# Patient Record
Sex: Female | Born: 1950 | Race: Black or African American | Hispanic: No
Health system: Southern US, Community
[De-identification: ages and names within clinical notes are randomized; demographics above are authoritative.]

## PROBLEM LIST (undated history)

## (undated) DIAGNOSIS — A419 Sepsis, unspecified organism: Secondary | ICD-10-CM

## (undated) DIAGNOSIS — I482 Chronic atrial fibrillation, unspecified: Secondary | ICD-10-CM

## (undated) DIAGNOSIS — S27322A Contusion of lung, bilateral, initial encounter: Secondary | ICD-10-CM

## (undated) DIAGNOSIS — I270 Primary pulmonary hypertension: Secondary | ICD-10-CM

## (undated) DIAGNOSIS — R6520 Severe sepsis without septic shock: Secondary | ICD-10-CM

## (undated) DIAGNOSIS — S299XXA Unspecified injury of thorax, initial encounter: Secondary | ICD-10-CM

## (undated) DIAGNOSIS — J9621 Acute and chronic respiratory failure with hypoxia: Secondary | ICD-10-CM

---

## 2018-02-07 ENCOUNTER — Other Ambulatory Visit (HOSPITAL_COMMUNITY): Payer: Self-pay

## 2018-02-07 ENCOUNTER — Inpatient Hospital Stay
Admission: AD | Admit: 2018-02-07 | Discharge: 2018-03-08 | Disposition: A | Discharge status: Rehabilitation Facility | Payer: Medicare HMO | Source: Other Acute Inpatient Hospital | Attending: Internal Medicine | Admitting: Internal Medicine

## 2018-02-07 DIAGNOSIS — Z4659 Encounter for fitting and adjustment of other gastrointestinal appliance and device: Secondary | ICD-10-CM

## 2018-02-07 DIAGNOSIS — R6520 Severe sepsis without septic shock: Secondary | ICD-10-CM

## 2018-02-07 DIAGNOSIS — T85598A Other mechanical complication of other gastrointestinal prosthetic devices, implants and grafts, initial encounter: Secondary | ICD-10-CM

## 2018-02-07 DIAGNOSIS — I270 Primary pulmonary hypertension: Secondary | ICD-10-CM | POA: Diagnosis present

## 2018-02-07 DIAGNOSIS — A419 Sepsis, unspecified organism: Secondary | ICD-10-CM | POA: Diagnosis present

## 2018-02-07 DIAGNOSIS — J9621 Acute and chronic respiratory failure with hypoxia: Secondary | ICD-10-CM | POA: Diagnosis present

## 2018-02-07 DIAGNOSIS — J9690 Respiratory failure, unspecified, unspecified whether with hypoxia or hypercapnia: Secondary | ICD-10-CM

## 2018-02-07 DIAGNOSIS — I482 Chronic atrial fibrillation, unspecified: Secondary | ICD-10-CM | POA: Diagnosis present

## 2018-02-07 DIAGNOSIS — R52 Pain, unspecified: Secondary | ICD-10-CM

## 2018-02-07 DIAGNOSIS — S299XXA Unspecified injury of thorax, initial encounter: Secondary | ICD-10-CM | POA: Diagnosis present

## 2018-02-07 DIAGNOSIS — S27322A Contusion of lung, bilateral, initial encounter: Secondary | ICD-10-CM | POA: Diagnosis present

## 2018-02-07 HISTORY — DX: Sepsis, unspecified organism: A41.9

## 2018-02-07 HISTORY — DX: Sepsis, unspecified organism: R65.20

## 2018-02-07 HISTORY — DX: Unspecified injury of thorax, initial encounter: S29.9XXA

## 2018-02-07 HISTORY — DX: Primary pulmonary hypertension: I27.0

## 2018-02-07 HISTORY — DX: Chronic atrial fibrillation, unspecified: I48.20

## 2018-02-07 HISTORY — DX: Acute and chronic respiratory failure with hypoxia: J96.21

## 2018-02-07 HISTORY — DX: Contusion of lung, bilateral, initial encounter: S27.322A

## 2018-02-08 DIAGNOSIS — S27322S Contusion of lung, bilateral, sequela: Secondary | ICD-10-CM

## 2018-02-08 DIAGNOSIS — R6520 Severe sepsis without septic shock: Secondary | ICD-10-CM

## 2018-02-08 DIAGNOSIS — S299XXS Unspecified injury of thorax, sequela: Secondary | ICD-10-CM

## 2018-02-08 DIAGNOSIS — I482 Chronic atrial fibrillation, unspecified: Secondary | ICD-10-CM

## 2018-02-08 DIAGNOSIS — J9621 Acute and chronic respiratory failure with hypoxia: Secondary | ICD-10-CM

## 2018-02-08 DIAGNOSIS — I270 Primary pulmonary hypertension: Secondary | ICD-10-CM

## 2018-02-08 DIAGNOSIS — A419 Sepsis, unspecified organism: Secondary | ICD-10-CM

## 2018-02-08 LAB — CBC
HCT: 37.4 % (ref 36.0–46.0)
Hemoglobin: 11.1 g/dL — ABNORMAL LOW (ref 12.0–15.0)
MCH: 28.3 pg (ref 26.0–34.0)
MCHC: 29.7 g/dL — AB (ref 30.0–36.0)
MCV: 95.4 fL (ref 80.0–100.0)
Platelets: 277 10*3/uL (ref 150–400)
RBC: 3.92 MIL/uL (ref 3.87–5.11)
RDW: 22.3 % — AB (ref 11.5–15.5)
WBC: 4.6 10*3/uL (ref 4.0–10.5)
nRBC: 0 % (ref 0.0–0.2)

## 2018-02-08 LAB — BLOOD GAS, ARTERIAL
Acid-Base Excess: 2.9 mmol/L — ABNORMAL HIGH (ref 0.0–2.0)
Bicarbonate: 26.9 mmol/L (ref 20.0–28.0)
FIO2: 0.3
MECHVT: 450 mL
O2 Saturation: 93.2 %
PATIENT TEMPERATURE: 98.6
PEEP: 5 cmH2O
RATE: 12 resp/min
pCO2 arterial: 41.7 mmHg (ref 32.0–48.0)
pH, Arterial: 7.426 (ref 7.350–7.450)
pO2, Arterial: 67.9 mmHg — ABNORMAL LOW (ref 83.0–108.0)

## 2018-02-08 LAB — BASIC METABOLIC PANEL
Anion gap: 8 (ref 5–15)
BUN: 12 mg/dL (ref 8–23)
CO2: 25 mmol/L (ref 22–32)
Calcium: 9 mg/dL (ref 8.9–10.3)
Chloride: 100 mmol/L (ref 98–111)
Creatinine, Ser: 0.54 mg/dL (ref 0.44–1.00)
GFR calc Af Amer: >60 mL/min (ref >60)
GFR calc non Af Amer: >60 mL/min (ref >60)
Glucose, Bld: 87 mg/dL (ref 70–99)
Potassium: 3.7 mmol/L (ref 3.5–5.1)
SODIUM: 133 mmol/L — AB (ref 135–145)

## 2018-02-08 LAB — C DIFFICILE QUICK SCREEN W PCR REFLEX
C Diff antigen: POSITIVE — AB
C Diff interpretation: DETECTED
C Diff toxin: POSITIVE — AB

## 2018-02-08 MED ORDER — ONDANSETRON HCL 4 MG/2ML IJ SOLN
4.00 | INTRAMUSCULAR | Status: DC
Start: ? — End: 2018-02-08

## 2018-02-08 MED ORDER — MELATONIN 3 MG PO TABS
6.00 | ORAL_TABLET | ORAL | Status: DC
Start: ? — End: 2018-02-08

## 2018-02-08 MED ORDER — ACETAMINOPHEN 500 MG PO TABS
1000.00 | ORAL_TABLET | ORAL | Status: DC
Start: ? — End: 2018-02-08

## 2018-02-08 MED ORDER — PHENOL 1.4 % MT LIQD
1.00 | OROMUCOSAL | Status: DC
Start: ? — End: 2018-02-08

## 2018-02-08 MED ORDER — POLYETHYLENE GLYCOL 3350 17 G PO PACK
17.00 | PACK | ORAL | Status: DC
Start: 2018-02-07 — End: 2018-02-08

## 2018-02-08 MED ORDER — CYCLOBENZAPRINE HCL 5 MG PO TABS
5.00 | ORAL_TABLET | ORAL | Status: DC
Start: ? — End: 2018-02-08

## 2018-02-08 MED ORDER — IPRATROPIUM-ALBUTEROL 0.5-2.5 (3) MG/3ML IN SOLN
3.00 | RESPIRATORY_TRACT | Status: DC
Start: 2018-02-07 — End: 2018-02-08

## 2018-02-08 MED ORDER — GENERIC EXTERNAL MEDICATION
Status: DC
Start: ? — End: 2018-02-08

## 2018-02-08 MED ORDER — CHLORHEXIDINE GLUCONATE 0.12 % MT SOLN
15.00 | OROMUCOSAL | Status: DC
Start: ? — End: 2018-02-08

## 2018-02-08 MED ORDER — SENNOSIDES-DOCUSATE SODIUM 8.6-50 MG PO TABS
1.00 | ORAL_TABLET | ORAL | Status: DC
Start: 2018-02-07 — End: 2018-02-08

## 2018-02-08 MED ORDER — SERTRALINE HCL 25 MG PO TABS
25.00 | ORAL_TABLET | ORAL | Status: DC
Start: 2018-02-08 — End: 2018-02-08

## 2018-02-08 MED ORDER — LIDOCAINE HCL URETHRAL/MUCOSAL 2 % EX GEL
CUTANEOUS | Status: DC
Start: ? — End: 2018-02-08

## 2018-02-08 MED ORDER — WITCH HAZEL-GLYCERIN EX PADS
MEDICATED_PAD | CUTANEOUS | Status: DC
Start: ? — End: 2018-02-08

## 2018-02-08 MED ORDER — HYDROXYZINE HCL 25 MG PO TABS
25.00 | ORAL_TABLET | ORAL | Status: DC
Start: ? — End: 2018-02-08

## 2018-02-08 MED ORDER — GABAPENTIN 100 MG PO CAPS
100.00 | ORAL_CAPSULE | ORAL | Status: DC
Start: 2018-02-07 — End: 2018-02-08

## 2018-02-08 MED ORDER — CHLORHEXIDINE GLUCONATE 0.12 % MT SOLN
15.00 | OROMUCOSAL | Status: DC
Start: 2018-02-07 — End: 2018-02-08

## 2018-02-08 MED ORDER — GENERIC EXTERNAL MEDICATION
500.00 | Status: DC
Start: ? — End: 2018-02-08

## 2018-02-08 MED ORDER — SILDENAFIL CITRATE 20 MG PO TABS
20.00 | ORAL_TABLET | ORAL | Status: DC
Start: 2018-02-07 — End: 2018-02-08

## 2018-02-08 MED ORDER — ENOXAPARIN SODIUM 100 MG/ML ~~LOC~~ SOLN
1.00 | SUBCUTANEOUS | Status: DC
Start: 2018-02-07 — End: 2018-02-08

## 2018-02-08 MED ORDER — GENERIC EXTERNAL MEDICATION
100.00 | Status: DC
Start: ? — End: 2018-02-08

## 2018-02-08 MED ORDER — AMIODARONE HCL 200 MG PO TABS
400.00 | ORAL_TABLET | ORAL | Status: DC
Start: 2018-02-08 — End: 2018-02-08

## 2018-02-08 MED ORDER — FAMOTIDINE 20 MG PO TABS
20.00 | ORAL_TABLET | ORAL | Status: DC
Start: 2018-02-07 — End: 2018-02-08

## 2018-02-08 MED ORDER — PHENYLEPHRINE-MINERAL OIL-PET 0.25-14-74.9 % RE OINT
TOPICAL_OINTMENT | RECTAL | Status: DC
Start: ? — End: 2018-02-08

## 2018-02-08 MED ORDER — DIGOXIN 125 MCG PO TABS
0.13 | ORAL_TABLET | ORAL | Status: DC
Start: 2018-02-09 — End: 2018-02-08

## 2018-02-08 MED ORDER — GENERIC EXTERNAL MEDICATION
Status: DC
Start: 2018-02-08 — End: 2018-02-08

## 2018-02-08 MED ORDER — NYSTATIN 100000 UNIT/ML MT SUSP
500000.00 | OROMUCOSAL | Status: DC
Start: 2018-02-07 — End: 2018-02-08
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
## 2018-02-09 ENCOUNTER — Other Ambulatory Visit (HOSPITAL_COMMUNITY): Payer: Self-pay

## 2018-02-09 ENCOUNTER — Encounter: Payer: Self-pay | Admitting: Internal Medicine

## 2018-02-09 DIAGNOSIS — I482 Chronic atrial fibrillation, unspecified: Secondary | ICD-10-CM | POA: Diagnosis not present

## 2018-02-09 DIAGNOSIS — I270 Primary pulmonary hypertension: Secondary | ICD-10-CM | POA: Diagnosis present

## 2018-02-09 DIAGNOSIS — S27322S Contusion of lung, bilateral, sequela: Secondary | ICD-10-CM | POA: Diagnosis not present

## 2018-02-09 DIAGNOSIS — J9621 Acute and chronic respiratory failure with hypoxia: Secondary | ICD-10-CM | POA: Diagnosis present

## 2018-02-09 DIAGNOSIS — S27322A Contusion of lung, bilateral, initial encounter: Secondary | ICD-10-CM | POA: Diagnosis present

## 2018-02-09 DIAGNOSIS — R6520 Severe sepsis without septic shock: Secondary | ICD-10-CM

## 2018-02-09 DIAGNOSIS — S299XXS Unspecified injury of thorax, sequela: Secondary | ICD-10-CM | POA: Diagnosis not present

## 2018-02-09 DIAGNOSIS — A419 Sepsis, unspecified organism: Secondary | ICD-10-CM | POA: Diagnosis present

## 2018-02-09 DIAGNOSIS — S299XXA Unspecified injury of thorax, initial encounter: Secondary | ICD-10-CM | POA: Diagnosis present

## 2018-02-09 NOTE — Progress Notes (Addendum)
[facility] [place]   PULMONARY CRITICAL CARE SERVICE  PROGRESS NOTE  Date of Service: 02/09/2018  Barbara ShortLinda M Osborn  ZOX:[id number]  DOB: [birth year]   DOA: 02/07/2018  Referring Physician: Carron CurieAli Hijazi, MD  HPI: Barbara ShortLinda M Osborn is a 68 y.o. female seen for follow up of Acute on Chronic Respiratory Failure.  Patient remains on full support at this time.  She was able to do 8 hours on pressure support today 12/5 with an FiO2 28%.  She is now currently resting on the ventilator and weaning will continue tomorrow.  Medications: Reviewed on Rounds  Physical Exam:  Vitals: 73 respirations 24 blood pressure 124/72 O2 sat 99% temp 97.2  Ventilator Settings ventilator mode AC VC rate of 12 tidal volume 450 FiO2 28% PEEP of 5  . General: Comfortable at this time . Eyes: Grossly normal lids, irises & conjunctiva . ENT: grossly tongue is normal . Neck: no obvious mass . Cardiovascular: S1 S2 normal no gallop . Respiratory: Coarse breath sounds . Abdomen: soft . Skin: no rash seen on limited exam . Musculoskeletal: not rigid . Psychiatric:unable to assess . Neurologic: no seizure no involuntary movements         Lab Data:   Basic Metabolic Panel: Recent Labs  Lab 02/08/18 0450  NA 133*  K 3.7  CL 100  CO2 25  GLUCOSE 87  BUN 12  CREATININE 0.54  CALCIUM 9.0    ABG: Recent Labs  Lab 02/08/18 0515  PHART 7.426  PCO2ART 41.7  PO2ART 67.9*  HCO3 26.9  O2SAT 93.2    Liver Function Tests: No results for input(s): AST, ALT, ALKPHOS, BILITOT, PROT, ALBUMIN in the last 168 hours. No results for input(s): LIPASE, AMYLASE in the last 168 hours. No results for input(s): AMMONIA in the last 168 hours.  CBC: Recent Labs  Lab 02/08/18 0450  WBC 4.6  HGB 11.1*  HCT 37.4  MCV 95.4  PLT 277    Cardiac Enzymes: No results for input(s): CKTOTAL, CKMB, CKMBINDEX, TROPONINI in the last 168 hours.  BNP (last 3 results) No  results for input(s): BNP in the last 8760 hours.  ProBNP (last 3 results) No results for input(s): PROBNP in the last 8760 hours.  Radiological Exams: Dg Chest Port 1 View  Result Date: 02/09/2018 CLINICAL DATA:  Respiratory failure EXAM: PORTABLE CHEST 1 VIEW COMPARISON:  None. FINDINGS: Cardiac shadows within normal limits. Tracheostomy tube, feeding catheter and right chest wall port are noted in satisfactory position. Cardiac shadow is mildly enlarged but accentuated by the frontal technique. Left lung is well aerated with minimal basilar atelectasis. Volume loss on the right is noted consistent with prior surgical changes. Some basilar atelectatic changes are noted as well. No pneumothorax is seen. IMPRESSION: Bibasilar atelectasis. Postoperative changes on the right. Tubes and lines as described. Electronically Signed   By: Alcide CleverMark  Lukens M.D.   On: 02/09/2018 08:17   Dg Abd Portable 1v  Result Date: 02/07/2018 CLINICAL DATA:  NG tube placement EXAM: PORTABLE ABDOMEN - 1 VIEW COMPARISON:  None. FINDINGS: Feeding tube loops in the right lower abdomen which I suspect is looping in the distal stomach with the tip in the mid stomach. This could be within the duodenum, but I favor the tip being in the mid stomach. Nonobstructive bowel gas pattern. IMPRESSION: Feeding tube loops in the right lower abdomen, favor looping in the stomach with the tip in the mid stomach. This conceivably could be within  the duodenum. Exact location could be confirmed with contrast injection and repeat KUB. Electronically Signed   By: Charlett NoseKevin  Dover M.D.   On: 02/07/2018 21:04    Assessment/Plan Active Problems:   Acute on chronic respiratory failure with hypoxia (HCC)   Primary pulmonary hypertension (HCC)   Chronic atrial fibrillation   Bilateral pulmonary contusion   Severe sepsis (HCC)   Multiple trauma to chest   1. Acute on chronic respiratory failure with hypoxia patient remains on full support at this time.   She was able to do 8 hours on pressure support today and weaning will continue tomorrow. 2. Primary pulmonary hypertension patient is on sildenafil. 3. Chronic atrial fibrillation cardiology consult in place 4. Pulmonary contusion supportive care monitor for hemoptysis 5. Sepsis with shock hemodynamically stable at this time continue present therapy   I have personally seen and evaluated the patient, evaluated laboratory and imaging results, formulated the assessment and plan and placed orders. The Patient requires high complexity decision making for assessment and support.  Case was discussed on Rounds with the Respiratory Therapy Staff  Yevonne PaxSaadat A Khan, MD [facility] Pulmonary Critical Care Medicine Sleep Medicine

## 2018-02-10 DIAGNOSIS — S27322S Contusion of lung, bilateral, sequela: Secondary | ICD-10-CM | POA: Diagnosis not present

## 2018-02-10 DIAGNOSIS — J9621 Acute and chronic respiratory failure with hypoxia: Secondary | ICD-10-CM | POA: Diagnosis not present

## 2018-02-10 DIAGNOSIS — I482 Chronic atrial fibrillation, unspecified: Secondary | ICD-10-CM | POA: Diagnosis not present

## 2018-02-10 DIAGNOSIS — S299XXS Unspecified injury of thorax, sequela: Secondary | ICD-10-CM | POA: Diagnosis not present

## 2018-02-10 NOTE — Progress Notes (Signed)
[facility] [place]   PULMONARY CRITICAL CARE SERVICE  PROGRESS NOTE  Date of Service: 02/10/2018  Barbara ShortLinda M Osborn  ZOX:[id number]  DOB: 27-Feb-1950   DOA: 02/07/2018  Referring Physician: Carron CurieAli Hijazi, MD  HPI: Barbara ShortLinda M Osborn is a 68 y.o. female seen for follow up of Acute on Chronic Respiratory Failure.  Patient is doing well remains on pressure support weaning right now the goal is for 12 hours  Medications: Reviewed on Rounds  Physical Exam:  Vitals: Temperature 97.8 pulse 73 respiratory rate 17 blood pressure 117/72 saturations 98%  Ventilator Settings currently is on pressure support FiO2 28% tidal volume 450 PEEP 5 pressure support 12  . General: Comfortable at this time . Eyes: Grossly normal lids, irises & conjunctiva . ENT: grossly tongue is normal . Neck: no obvious mass . Cardiovascular: S1 S2 normal no gallop . Respiratory: Coarse breath sounds are noted bilaterally . Abdomen: soft . Skin: no rash seen on limited exam . Musculoskeletal: not rigid . Psychiatric:unable to assess . Neurologic: no seizure no involuntary movements         Lab Data:   Basic Metabolic Panel: Recent Labs  Lab 02/08/18 0450  NA 133*  K 3.7  CL 100  CO2 25  GLUCOSE 87  BUN 12  CREATININE 0.54  CALCIUM 9.0    ABG: Recent Labs  Lab 02/08/18 0515  PHART 7.426  PCO2ART 41.7  PO2ART 67.9*  HCO3 26.9  O2SAT 93.2    Liver Function Tests: No results for input(s): AST, ALT, ALKPHOS, BILITOT, PROT, ALBUMIN in the last 168 hours. No results for input(s): LIPASE, AMYLASE in the last 168 hours. No results for input(s): AMMONIA in the last 168 hours.  CBC: Recent Labs  Lab 02/08/18 0450  WBC 4.6  HGB 11.1*  HCT 37.4  MCV 95.4  PLT 277    Cardiac Enzymes: No results for input(s): CKTOTAL, CKMB, CKMBINDEX, TROPONINI in the last 168 hours.  BNP (last 3 results) No results for input(s): BNP in the last 8760  hours.  ProBNP (last 3 results) No results for input(s): PROBNP in the last 8760 hours.  Radiological Exams: Dg Chest Port 1 View  Result Date: 02/09/2018 CLINICAL DATA:  Respiratory failure EXAM: PORTABLE CHEST 1 VIEW COMPARISON:  None. FINDINGS: Cardiac shadows within normal limits. Tracheostomy tube, feeding catheter and right chest wall port are noted in satisfactory position. Cardiac shadow is mildly enlarged but accentuated by the frontal technique. Left lung is well aerated with minimal basilar atelectasis. Volume loss on the right is noted consistent with prior surgical changes. Some basilar atelectatic changes are noted as well. No pneumothorax is seen. IMPRESSION: Bibasilar atelectasis. Postoperative changes on the right. Tubes and lines as described. Electronically Signed   By: Alcide CleverMark  Lukens M.D.   On: 02/09/2018 08:17    Assessment/Plan Active Problems:   Acute on chronic respiratory failure with hypoxia (HCC)   Primary pulmonary hypertension (HCC)   Chronic atrial fibrillation   Bilateral pulmonary contusion   Severe sepsis (HCC)   Multiple trauma to chest   1. Acute on chronic respiratory failure with hypoxia we will continue the wean as tolerated continue secretion management pulmonary toilet. 2. Primary pulmonary hypertension continue with treatment as ordered 3. Chronic atrial fibrillation rate controlled 4. Pulmonary contusions follow-up chest x-ray still showing some postoperative changes and some atelectatic areas needs ongoing aggressive pulmonary toilet 5. Severe sepsis hemodynamically stable 6. Multiple chest trauma continue with supportive care therapy  as tolerated   I have personally seen and evaluated the patient, evaluated laboratory and imaging results, formulated the assessment and plan and placed orders. The Patient requires high complexity decision making for assessment and support.  Case was discussed on Rounds with the Respiratory Therapy Staff  Yevonne Pax, MD [facility] Pulmonary Critical Care Medicine Sleep Medicine

## 2018-02-11 DIAGNOSIS — S27322S Contusion of lung, bilateral, sequela: Secondary | ICD-10-CM | POA: Diagnosis not present

## 2018-02-11 DIAGNOSIS — I482 Chronic atrial fibrillation, unspecified: Secondary | ICD-10-CM | POA: Diagnosis not present

## 2018-02-11 DIAGNOSIS — S299XXS Unspecified injury of thorax, sequela: Secondary | ICD-10-CM | POA: Diagnosis not present

## 2018-02-11 DIAGNOSIS — J9621 Acute and chronic respiratory failure with hypoxia: Secondary | ICD-10-CM | POA: Diagnosis not present

## 2018-02-11 NOTE — Progress Notes (Signed)
Pulmonary Critical Care Medicine [facility] [place]   PULMONARY CRITICAL CARE SERVICE  PROGRESS NOTE  Date of Service: 02/11/2018  Barbara Osborn  MWU:[id number]  DOB: 06/21/1950   DOA: 02/07/2018  Referring Physician: Carron Curie, MD  HPI: Barbara Osborn is a 68 y.o. female seen for follow up of Acute on Chronic Respiratory Failure.  Patient is on pressure support weaning goal is for 16 hours  Medications: Reviewed on Rounds  Physical Exam:  Vitals: Temperature 97.6 pulse 77 respiratory 26 blood pressure 116/74 saturations 99%  Ventilator Settings currently on pressure support FiO2 28% tidal volume 672 pressure support 12 PEEP 5  . General: Comfortable at this time . Eyes: Grossly normal lids, irises & conjunctiva . ENT: grossly tongue is normal . Neck: no obvious mass . Cardiovascular: S1 S2 normal no gallop . Respiratory: Scattered rhonchi expansion is equal . Abdomen: soft . Skin: no rash seen on limited exam . Musculoskeletal: not rigid . Psychiatric:unable to assess . Neurologic: no seizure no involuntary movements         Lab Data:   Basic Metabolic Panel: Recent Labs  Lab 02/08/18 0450  NA 133*  K 3.7  CL 100  CO2 25  GLUCOSE 87  BUN 12  CREATININE 0.54  CALCIUM 9.0    ABG: Recent Labs  Lab 02/08/18 0515  PHART 7.426  PCO2ART 41.7  PO2ART 67.9*  HCO3 26.9  O2SAT 93.2    Liver Function Tests: No results for input(s): AST, ALT, ALKPHOS, BILITOT, PROT, ALBUMIN in the last 168 hours. No results for input(s): LIPASE, AMYLASE in the last 168 hours. No results for input(s): AMMONIA in the last 168 hours.  CBC: Recent Labs  Lab 02/08/18 0450  WBC 4.6  HGB 11.1*  HCT 37.4  MCV 95.4  PLT 277    Cardiac Enzymes: No results for input(s): CKTOTAL, CKMB, CKMBINDEX, TROPONINI in the last 168 hours.  BNP (last 3 results) No results for input(s): BNP in the last 8760 hours.  ProBNP (last 3 results) No results for input(s):  PROBNP in the last 8760 hours.  Radiological Exams: No results found.  Assessment/Plan Active Problems:   Acute on chronic respiratory failure with hypoxia (HCC)   Primary pulmonary hypertension (HCC)   Chronic atrial fibrillation   Bilateral pulmonary contusion   Severe sepsis (HCC)   Multiple trauma to chest   1. Acute on chronic respiratory failure with hypoxia we will continue with the pressure support mode as mentioned above the goal is 16 hours 2. Primary pulmonary hypertension continue with present management 3. Chronic atrial fibrillation rate controlled 4. Bilateral pulmonary contusions slow improvement 5. Severe sepsis hemodynamically stable 6. Multiple chest trauma continue with supportive therapy   I have personally seen and evaluated the patient, evaluated laboratory and imaging results, formulated the assessment and plan and placed orders. The Patient requires high complexity decision making for assessment and support.  Case was discussed on Rounds with the Respiratory Therapy Staff  Yevonne Pax, MD [facility] Pulmonary Critical Care Medicine Sleep Medicine

## 2018-02-12 DIAGNOSIS — S299XXS Unspecified injury of thorax, sequela: Secondary | ICD-10-CM | POA: Diagnosis not present

## 2018-02-12 DIAGNOSIS — I482 Chronic atrial fibrillation, unspecified: Secondary | ICD-10-CM | POA: Diagnosis not present

## 2018-02-12 DIAGNOSIS — S27322S Contusion of lung, bilateral, sequela: Secondary | ICD-10-CM | POA: Diagnosis not present

## 2018-02-12 DIAGNOSIS — J9621 Acute and chronic respiratory failure with hypoxia: Secondary | ICD-10-CM | POA: Diagnosis not present

## 2018-02-12 LAB — CBC
HCT: 39.3 % (ref 36.0–46.0)
HEMOGLOBIN: 11.8 g/dL — AB (ref 12.0–15.0)
MCH: 28.4 pg (ref 26.0–34.0)
MCHC: 30 g/dL (ref 30.0–36.0)
MCV: 94.7 fL (ref 80.0–100.0)
Platelets: 310 10*3/uL (ref 150–400)
RBC: 4.15 MIL/uL (ref 3.87–5.11)
RDW: 20.6 % — ABNORMAL HIGH (ref 11.5–15.5)
WBC: 5.7 10*3/uL (ref 4.0–10.5)
nRBC: 0 % (ref 0.0–0.2)

## 2018-02-12 LAB — BASIC METABOLIC PANEL
ANION GAP: 10 (ref 5–15)
BUN: 11 mg/dL (ref 8–23)
CO2: 31 mmol/L (ref 22–32)
Calcium: 9.1 mg/dL (ref 8.9–10.3)
Chloride: 92 mmol/L — ABNORMAL LOW (ref 98–111)
Creatinine, Ser: 0.54 mg/dL (ref 0.44–1.00)
GFR calc Af Amer: >60 mL/min (ref >60)
GFR calc non Af Amer: >60 mL/min (ref >60)
Glucose, Bld: 125 mg/dL — ABNORMAL HIGH (ref 70–99)
Potassium: 3.4 mmol/L — ABNORMAL LOW (ref 3.5–5.1)
Sodium: 133 mmol/L — ABNORMAL LOW (ref 135–145)

## 2018-02-12 LAB — PHOSPHORUS: Phosphorus: 3.2 mg/dL (ref 2.5–4.6)

## 2018-02-12 LAB — MAGNESIUM: Magnesium: 1.9 mg/dL (ref 1.7–2.4)

## 2018-02-12 NOTE — Progress Notes (Addendum)
Pulmonary Critical Care Medicine [facility] [place]   PULMONARY CRITICAL CARE SERVICE  PROGRESS NOTE  Date of Service: 02/12/2018  Barbara Osborn  MMC:[id number]  DOB: May 01, 1950   DOA: 02/07/2018  Referring Physician: Carron Curie, MD  HPI: Barbara Osborn is a 68 y.o. female seen for follow up of Acute on Chronic Respiratory Failure.  Patient is doing well and was able to go on trach collar with 40% FiO2 for 2 hours today.  She is currently back on pressure support 12/5 to rest.  Medications: Reviewed on Rounds  Physical Exam:  Vitals: Pulse 76 respirations 20 BP 127/75 O2 sat 93% temp 98.2  Ventilator Settings ventilator mode pressure support 12/5  . General: Comfortable at this time . Eyes: Grossly normal lids, irises & conjunctiva . ENT: grossly tongue is normal . Neck: no obvious mass . Cardiovascular: S1 S2 normal no gallop . Respiratory: Coarse breath sounds . Abdomen: soft . Skin: no rash seen on limited exam . Musculoskeletal: not rigid . Psychiatric:unable to assess . Neurologic: no seizure no involuntary movements         Lab Data:   Basic Metabolic Panel: Recent Labs  Lab 02/08/18 0450 02/12/18 0651  NA 133* 133*  K 3.7 3.4*  CL 100 92*  CO2 25 31  GLUCOSE 87 125*  BUN 12 11  CREATININE 0.54 0.54  CALCIUM 9.0 9.1  MG  --  1.9  PHOS  --  3.2    ABG: Recent Labs  Lab 02/08/18 0515  PHART 7.426  PCO2ART 41.7  PO2ART 67.9*  HCO3 26.9  O2SAT 93.2    Liver Function Tests: No results for input(s): AST, ALT, ALKPHOS, BILITOT, PROT, ALBUMIN in the last 168 hours. No results for input(s): LIPASE, AMYLASE in the last 168 hours. No results for input(s): AMMONIA in the last 168 hours.  CBC: Recent Labs  Lab 02/08/18 0450 02/12/18 0651  WBC 4.6 5.7  HGB 11.1* 11.8*  HCT 37.4 39.3  MCV 95.4 94.7  PLT 277 310    Cardiac Enzymes: No results for input(s): CKTOTAL, CKMB, CKMBINDEX, TROPONINI in the last 168 hours.  BNP (last  3 results) No results for input(s): BNP in the last 8760 hours.  ProBNP (last 3 results) No results for input(s): PROBNP in the last 8760 hours.  Radiological Exams: No results found.  Assessment/Plan Active Problems:   Acute on chronic respiratory failure with hypoxia (HCC)   Primary pulmonary hypertension (HCC)   Chronic atrial fibrillation   Bilateral pulmonary contusion   Severe sepsis (HCC)   Multiple trauma to chest   1. Acute on chronic respiratory failure with hypoxia continue trach collar trials as per protocol.  Continue aggressive pulmonary toilet. 2. Primary pulmonary hypertension continue present management 3. Chronic atrial fibrillation rate controlled 4. Bilateral pulmonary contusion slow improvement 5. Severe sepsis hemodynamically stable 6. Multiple chest trauma continue supportive therapy   I have personally seen and evaluated the patient, evaluated laboratory and imaging results, formulated the assessment and plan and placed orders. The Patient requires high complexity decision making for assessment and support.  Case was discussed on Rounds with the Respiratory Therapy Staff  Yevonne Pax, MD [facility] Pulmonary Critical Care Medicine Sleep Medicine

## 2018-02-13 DIAGNOSIS — S299XXS Unspecified injury of thorax, sequela: Secondary | ICD-10-CM | POA: Diagnosis not present

## 2018-02-13 DIAGNOSIS — S27322S Contusion of lung, bilateral, sequela: Secondary | ICD-10-CM | POA: Diagnosis not present

## 2018-02-13 DIAGNOSIS — J9621 Acute and chronic respiratory failure with hypoxia: Secondary | ICD-10-CM | POA: Diagnosis not present

## 2018-02-13 DIAGNOSIS — I482 Chronic atrial fibrillation, unspecified: Secondary | ICD-10-CM | POA: Diagnosis not present

## 2018-02-13 LAB — BASIC METABOLIC PANEL
Anion gap: 13 (ref 5–15)
BUN: 13 mg/dL (ref 8–23)
CO2: 32 mmol/L (ref 22–32)
Calcium: 9.3 mg/dL (ref 8.9–10.3)
Chloride: 88 mmol/L — ABNORMAL LOW (ref 98–111)
Creatinine, Ser: 0.7 mg/dL (ref 0.44–1.00)
GFR calc Af Amer: >60 mL/min (ref >60)
GFR calc non Af Amer: >60 mL/min (ref >60)
Glucose, Bld: 125 mg/dL — ABNORMAL HIGH (ref 70–99)
Potassium: 3.8 mmol/L (ref 3.5–5.1)
Sodium: 133 mmol/L — ABNORMAL LOW (ref 135–145)

## 2018-02-13 LAB — PROTIME-INR
INR: 1.09
PROTHROMBIN TIME: 14 s (ref 11.4–15.2)

## 2018-02-13 NOTE — Progress Notes (Addendum)
[facility] [place]   PULMONARY CRITICAL CARE SERVICE  PROGRESS NOTE  Date of Service: 02/13/2018  Barbara ShortLinda M Osborn  [id number]:[id number]  DOB: 1950/06/07   DOA: 02/07/2018  Referring Physician: Carron CurieAli Hijazi, MD  HPI: Barbara ShortLinda M Osborn is a 68 y.o. female seen for follow up of Acute on Chronic Respiratory Failure.  Patient is currently doing well on trach collar FiO2 40%.  The goal today is 4 hours.  And then the patient will rest on pressure support 12/5 with an FiO2 of 40%.  Minimal secretions noted at this time  Medications: Reviewed on Rounds  Physical Exam:  Vitals: Pulse 73 respirations 21 BP 135/80 O2 sat 99% temp 97.2  Ventilator Settings patient's not currently on ventilator  . General: Comfortable at this time . Eyes: Grossly normal lids, irises & conjunctiva . ENT: grossly tongue is normal . Neck: no obvious mass . Cardiovascular: S1 S2 normal no gallop . Respiratory: Coarse breath sounds . Abdomen: soft . Skin: no rash seen on limited exam . Musculoskeletal: not rigid . Psychiatric:unable to assess . Neurologic: no seizure no involuntary movements         Lab Data:   Basic Metabolic Panel: Recent Labs  Lab 02/08/18 0450 02/12/18 0651 02/13/18 0903  NA 133* 133* 133*  K 3.7 3.4* 3.8  CL 100 92* 88*  CO2 25 31 32  GLUCOSE 87 125* 125*  BUN 12 11 13   CREATININE 0.54 0.54 0.70  CALCIUM 9.0 9.1 9.3  MG  --  1.9  --   PHOS  --  3.2  --     ABG: Recent Labs  Lab 02/08/18 0515  PHART 7.426  PCO2ART 41.7  PO2ART 67.9*  HCO3 26.9  O2SAT 93.2    Liver Function Tests: No results for input(s): AST, ALT, ALKPHOS, BILITOT, PROT, ALBUMIN in the last 168 hours. No results for input(s): LIPASE, AMYLASE in the last 168 hours. No results for input(s): AMMONIA in the last 168 hours.  CBC: Recent Labs  Lab 02/08/18 0450 02/12/18 0651  WBC 4.6 5.7  HGB 11.1* 11.8*  HCT 37.4 39.3  MCV 95.4 94.7  PLT 277 310     Cardiac Enzymes: No results for input(s): CKTOTAL, CKMB, CKMBINDEX, TROPONINI in the last 168 hours.  BNP (last 3 results) No results for input(s): BNP in the last 8760 hours.  ProBNP (last 3 results) No results for input(s): PROBNP in the last 8760 hours.  Radiological Exams: No results found.  Assessment/Plan Active Problems:   Acute on chronic respiratory failure with hypoxia (HCC)   Primary pulmonary hypertension (HCC)   Chronic atrial fibrillation   Bilateral pulmonary contusion   Severe sepsis (HCC)   Multiple trauma to chest   1. Acute on chronic respiratory failure with hypoxia continue trach collar trials per protocol.  Continue aggressive pulmonary toilet and secretion management. 2. Primary pulmonary hypertension continue present management 3. Chronic atrial fibrillation rate controlled 4. Bilateral pulmonary contusions no improvement 5. Severe sepsis hemodynamically stable 6. Multiple chest trauma continue supportive therapy   I have personally seen and evaluated the patient, evaluated laboratory and imaging results, formulated the assessment and plan and placed orders. The Patient requires high complexity decision making for assessment and support.  Case was discussed on Rounds with the Respiratory Therapy Staff  Yevonne PaxSaadat A Khan, MD [facility] Pulmonary Critical Care Medicine Sleep Medicine

## 2018-02-14 ENCOUNTER — Other Ambulatory Visit (HOSPITAL_COMMUNITY): Payer: Self-pay

## 2018-02-14 DIAGNOSIS — I482 Chronic atrial fibrillation, unspecified: Secondary | ICD-10-CM | POA: Diagnosis not present

## 2018-02-14 DIAGNOSIS — S27322S Contusion of lung, bilateral, sequela: Secondary | ICD-10-CM | POA: Diagnosis not present

## 2018-02-14 DIAGNOSIS — J9621 Acute and chronic respiratory failure with hypoxia: Secondary | ICD-10-CM | POA: Diagnosis not present

## 2018-02-14 DIAGNOSIS — S299XXS Unspecified injury of thorax, sequela: Secondary | ICD-10-CM | POA: Diagnosis not present

## 2018-02-14 LAB — URIC ACID: Uric Acid, Serum: 6.3 mg/dL (ref 2.5–7.1)

## 2018-02-14 LAB — PROTIME-INR
INR: 1.13
Prothrombin Time: 14.4 seconds (ref 11.4–15.2)

## 2018-02-14 NOTE — Progress Notes (Signed)
[facility] [place]   PULMONARY CRITICAL CARE SERVICE  PROGRESS NOTE  Date of Service: 02/14/2018  Barbara ShortLinda M Mirabal  [id number]  DOB: December 18, 1950   DOA: 02/07/2018  Referring Physician: Carron CurieAli Hijazi, MD  HPI: Barbara Osborn is a 68 y.o. female seen for follow up of Acute on Chronic Respiratory Failure.  Patient right now is on T collar the goal is for 8 hours doing fairly well at this time  Medications: Reviewed on Rounds  Physical Exam:  Vitals: Temperature 97.7 pulse 73 respiratory 24 blood pressure 110/65 saturations 99%  Ventilator Settings off the ventilator on T collar right now  . General: Comfortable at this time . Eyes: Grossly normal lids, irises & conjunctiva . ENT: grossly tongue is normal . Neck: no obvious mass . Cardiovascular: S1 S2 normal no gallop . Respiratory: Scattered rhonchi expansion is equal . Abdomen: soft . Skin: no rash seen on limited exam . Musculoskeletal: not rigid . Psychiatric:unable to assess . Neurologic: no seizure no involuntary movements         Lab Data:   Basic Metabolic Panel: Recent Labs  Lab 02/08/18 0450 02/12/18 0651 02/13/18 0903  NA 133* 133* 133*  K 3.7 3.4* 3.8  CL 100 92* 88*  CO2 25 31 32  GLUCOSE 87 125* 125*  BUN 12 11 13   CREATININE 0.54 0.54 0.70  CALCIUM 9.0 9.1 9.3  MG  --  1.9  --   PHOS  --  3.2  --     ABG: Recent Labs  Lab 02/08/18 0515  PHART 7.426  PCO2ART 41.7  PO2ART 67.9*  HCO3 26.9  O2SAT 93.2    Liver Function Tests: No results for input(s): AST, ALT, ALKPHOS, BILITOT, PROT, ALBUMIN in the last 168 hours. No results for input(s): LIPASE, AMYLASE in the last 168 hours. No results for input(s): AMMONIA in the last 168 hours.  CBC: Recent Labs  Lab 02/08/18 0450 02/12/18 0651  WBC 4.6 5.7  HGB 11.1* 11.8*  HCT 37.4 39.3  MCV 95.4 94.7  PLT 277 310    Cardiac Enzymes: No results for input(s): CKTOTAL, CKMB, CKMBINDEX,  TROPONINI in the last 168 hours.  BNP (last 3 results) No results for input(s): BNP in the last 8760 hours.  ProBNP (last 3 results) No results for input(s): PROBNP in the last 8760 hours.  Radiological Exams: No results found.  Assessment/Plan Active Problems:   Acute on chronic respiratory failure with hypoxia (HCC)   Primary pulmonary hypertension (HCC)   Chronic atrial fibrillation   Bilateral pulmonary contusion   Severe sepsis (HCC)   Multiple trauma to chest   1. Acute on chronic respiratory failure with hypoxia we will continue with T collar titrate oxygen continue pulmonary toilet.  The goal is for 8 hours 2. Primary pulmonary hypertension on oxygen therapy continue present management 3. Chronic atrial fibrillation rate is controlled 4. Pulmonary contusion at baseline continue to follow 5. Severe sepsis resolved 6. Multiple trauma to the chest she is doing better with the wean we will continue to advance   I have personally seen and evaluated the patient, evaluated laboratory and imaging results, formulated the assessment and plan and placed orders. The Patient requires high complexity decision making for assessment and support.  Case was discussed on Rounds with the Respiratory Therapy Staff  Yevonne PaxSaadat A Dejanee Thibeaux, MD [facility] Pulmonary Critical Care Medicine Sleep Medicine

## 2018-02-15 DIAGNOSIS — J9621 Acute and chronic respiratory failure with hypoxia: Secondary | ICD-10-CM | POA: Diagnosis not present

## 2018-02-15 DIAGNOSIS — I482 Chronic atrial fibrillation, unspecified: Secondary | ICD-10-CM | POA: Diagnosis not present

## 2018-02-15 DIAGNOSIS — S299XXS Unspecified injury of thorax, sequela: Secondary | ICD-10-CM | POA: Diagnosis not present

## 2018-02-15 DIAGNOSIS — S27322S Contusion of lung, bilateral, sequela: Secondary | ICD-10-CM | POA: Diagnosis not present

## 2018-02-15 LAB — PROTIME-INR
INR: 1.12
Prothrombin Time: 14.3 seconds (ref 11.4–15.2)

## 2018-02-15 LAB — C-REACTIVE PROTEIN: CRP: 2.7 mg/dL — ABNORMAL HIGH (ref <1.0)

## 2018-02-15 LAB — SEDIMENTATION RATE: Sed Rate: 98 mm/hr — ABNORMAL HIGH (ref 0–22)

## 2018-02-15 NOTE — Progress Notes (Signed)
Pulmonary Critical Care Medicine [facility] [place]   PULMONARY CRITICAL CARE SERVICE  PROGRESS NOTE  Date of Service: 02/15/2018  Barbara Osborn  [id number]  DOB: 12/21/1950   DOA: 02/07/2018  Referring Physician: Carron Curie, MD  HPI: Barbara Osborn is a 68 y.o. female seen for follow up of Acute on Chronic Respiratory Failure.  Currently patient is on a wean as her goal is 12 hours on T collar  Medications: Reviewed on Rounds  Physical Exam:  Vitals: Pitcher 98.6 pulse 80 respiratory 19 blood pressure 134/67 saturations 96%  Ventilator Settings off the ventilator on T collar  . General: Comfortable at this time . Eyes: Grossly normal lids, irises & conjunctiva . ENT: grossly tongue is normal . Neck: no obvious mass . Cardiovascular: S1 S2 normal no gallop . Respiratory: No rhonchi or rales are noted at this time . Abdomen: soft . Skin: no rash seen on limited exam . Musculoskeletal: not rigid . Psychiatric:unable to assess . Neurologic: no seizure no involuntary movements         Lab Data:   Basic Metabolic Panel: Recent Labs  Lab 02/12/18 0651 02/13/18 0903  NA 133* 133*  K 3.4* 3.8  CL 92* 88*  CO2 31 32  GLUCOSE 125* 125*  BUN 11 13  CREATININE 0.54 0.70  CALCIUM 9.1 9.3  MG 1.9  --   PHOS 3.2  --     ABG: No results for input(s): PHART, PCO2ART, PO2ART, HCO3, O2SAT in the last 168 hours.  Liver Function Tests: No results for input(s): AST, ALT, ALKPHOS, BILITOT, PROT, ALBUMIN in the last 168 hours. No results for input(s): LIPASE, AMYLASE in the last 168 hours. No results for input(s): AMMONIA in the last 168 hours.  CBC: Recent Labs  Lab 02/12/18 0651  WBC 5.7  HGB 11.8*  HCT 39.3  MCV 94.7  PLT 310    Cardiac Enzymes: No results for input(s): CKTOTAL, CKMB, CKMBINDEX, TROPONINI in the last 168 hours.  BNP (last 3 results) No results for input(s): BNP in the last 8760 hours.  ProBNP (last 3 results) No results  for input(s): PROBNP in the last 8760 hours.  Radiological Exams: Dg Knee Left Port  Result Date: 02/14/2018 CLINICAL DATA:  Acute left knee pain for the past day.  No injury. EXAM: PORTABLE LEFT KNEE - 1-2 VIEW COMPARISON:  None. FINDINGS: No evidence of fracture, dislocation, or joint effusion. No evidence of arthropathy or other focal bone abnormality. Soft tissues are unremarkable. IMPRESSION: Negative. Electronically Signed   By: Obie Dredge M.D.   On: 02/14/2018 15:33    Assessment/Plan Active Problems:   Acute on chronic respiratory failure with hypoxia (HCC)   Primary pulmonary hypertension (HCC)   Chronic atrial fibrillation   Bilateral pulmonary contusion   Severe sepsis (HCC)   Multiple trauma to chest   1. Acute on chronic respiratory failure with hypoxia continue to wean on T collar goal 12 hours continue with aggressive pulmonary toilet 2. Primary pulmonary hypertension on oxygen therapy supportive care 3. Chronic atrial fibrillation rate controlled 4. Bilateral pulmonary contusions slowly improving 5. Severe sepsis hemodynamically stable 6. Multiple chest traumas continue with present management supportive care   I have personally seen and evaluated the patient, evaluated laboratory and imaging results, formulated the assessment and plan and placed orders. The Patient requires high complexity decision making for assessment and support.  Case was discussed on Rounds with the Respiratory Therapy Staff  Yevonne Pax, MD  [facility] Pulmonary Critical Care Medicine Sleep Medicine

## 2018-02-16 DIAGNOSIS — I482 Chronic atrial fibrillation, unspecified: Secondary | ICD-10-CM | POA: Diagnosis not present

## 2018-02-16 DIAGNOSIS — S27322S Contusion of lung, bilateral, sequela: Secondary | ICD-10-CM | POA: Diagnosis not present

## 2018-02-16 DIAGNOSIS — S299XXS Unspecified injury of thorax, sequela: Secondary | ICD-10-CM | POA: Diagnosis not present

## 2018-02-16 DIAGNOSIS — J9621 Acute and chronic respiratory failure with hypoxia: Secondary | ICD-10-CM | POA: Diagnosis not present

## 2018-02-16 LAB — PROTIME-INR
INR: 1.1
Prothrombin Time: 14.1 seconds (ref 11.4–15.2)

## 2018-02-16 NOTE — Progress Notes (Signed)
Pulmonary Critical Care Medicine [facility] [place]   PULMONARY CRITICAL CARE SERVICE  PROGRESS NOTE  Date of Service: 02/16/2018  SADIYAH THEILEN  [id number]  DOB: 05/01/1950   DOA: 02/07/2018  Referring Physician: Carron Curie, MD  HPI: Barbara Osborn is a 68 y.o. female seen for follow up of Acute on Chronic Respiratory Failure.  Currently is on T collar with a goal of 16 hours looks good so far  Medications: Reviewed on Rounds  Physical Exam:  Vitals: Temperature 97.9 pulse 85 respiratory 22 blood pressure 106/71 saturations 97%  Ventilator Settings off the ventilator on T collar at this time currently on 35% FiO2  . General: Comfortable at this time . Eyes: Grossly normal lids, irises & conjunctiva . ENT: grossly tongue is normal . Neck: no obvious mass . Cardiovascular: S1 S2 normal no gallop . Respiratory: No rhonchi or rales are noted at this time . Abdomen: soft . Skin: no rash seen on limited exam . Musculoskeletal: not rigid . Psychiatric:unable to assess . Neurologic: no seizure no involuntary movements         Lab Data:   Basic Metabolic Panel: Recent Labs  Lab 02/12/18 0651 02/13/18 0903  NA 133* 133*  K 3.4* 3.8  CL 92* 88*  CO2 31 32  GLUCOSE 125* 125*  BUN 11 13  CREATININE 0.54 0.70  CALCIUM 9.1 9.3  MG 1.9  --   PHOS 3.2  --     ABG: No results for input(s): PHART, PCO2ART, PO2ART, HCO3, O2SAT in the last 168 hours.  Liver Function Tests: No results for input(s): AST, ALT, ALKPHOS, BILITOT, PROT, ALBUMIN in the last 168 hours. No results for input(s): LIPASE, AMYLASE in the last 168 hours. No results for input(s): AMMONIA in the last 168 hours.  CBC: Recent Labs  Lab 02/12/18 0651  WBC 5.7  HGB 11.8*  HCT 39.3  MCV 94.7  PLT 310    Cardiac Enzymes: No results for input(s): CKTOTAL, CKMB, CKMBINDEX, TROPONINI in the last 168 hours.  BNP (last 3 results) No results for input(s): BNP in the last 8760  hours.  ProBNP (last 3 results) No results for input(s): PROBNP in the last 8760 hours.  Radiological Exams: Dg Knee Left Port  Result Date: 02/14/2018 CLINICAL DATA:  Acute left knee pain for the past day.  No injury. EXAM: PORTABLE LEFT KNEE - 1-2 VIEW COMPARISON:  None. FINDINGS: No evidence of fracture, dislocation, or joint effusion. No evidence of arthropathy or other focal bone abnormality. Soft tissues are unremarkable. IMPRESSION: Negative. Electronically Signed   By: Obie Dredge M.D.   On: 02/14/2018 15:33    Assessment/Plan Active Problems:   Acute on chronic respiratory failure with hypoxia (HCC)   Primary pulmonary hypertension (HCC)   Chronic atrial fibrillation   Bilateral pulmonary contusion   Severe sepsis (HCC)   Multiple trauma to chest   1. Acute on chronic respiratory failure with hypoxia we will continue with T collar with a goal of 16 hours 2. Pulmonary hypertension at baseline continue with supportive care 3. Chronic atrial fibrillation rate is controlled at this time 4. Pulmonary contusion improving 5. Severe sepsis resolved 6. Multiple chest trauma continue present management   I have personally seen and evaluated the patient, evaluated laboratory and imaging results, formulated the assessment and plan and placed orders. The Patient requires high complexity decision making for assessment and support.  Case was discussed on Rounds with the Respiratory Therapy Staff  [facility]  Richardson Dopp, MD [facility] Pulmonary Critical Care Medicine Sleep Medicine

## 2018-02-17 DIAGNOSIS — I482 Chronic atrial fibrillation, unspecified: Secondary | ICD-10-CM | POA: Diagnosis not present

## 2018-02-17 DIAGNOSIS — S299XXS Unspecified injury of thorax, sequela: Secondary | ICD-10-CM | POA: Diagnosis not present

## 2018-02-17 DIAGNOSIS — S27322S Contusion of lung, bilateral, sequela: Secondary | ICD-10-CM | POA: Diagnosis not present

## 2018-02-17 DIAGNOSIS — J9621 Acute and chronic respiratory failure with hypoxia: Secondary | ICD-10-CM | POA: Diagnosis not present

## 2018-02-17 LAB — PROTIME-INR
INR: 1.26
Prothrombin Time: 15.6 seconds — ABNORMAL HIGH (ref 11.4–15.2)

## 2018-02-17 NOTE — Progress Notes (Signed)
Pulmonary Critical Care Medicine [facility] [place]   PULMONARY CRITICAL CARE SERVICE  PROGRESS NOTE  Date of Service: 02/17/2018  Barbara Osborn  [id number]  DOB: [birth year]   DOA: 02/07/2018  Referring Physician: Carron Curie, MD  HPI: Barbara Osborn is a 68 y.o. female seen for follow up of Acute on Chronic Respiratory Failure.  Currently on 35% FiO2 on T collar patient has been tolerating the wean very well with goal today is for 20 hours  Medications: Reviewed on Rounds  Physical Exam:  Vitals: Temperature 97.1 pulse 74 respiratory 25 blood pressure 115/75 saturations 98%  Ventilator Settings on T collar FiO2 35%  . General: Comfortable at this time . Eyes: Grossly normal lids, irises & conjunctiva . ENT: grossly tongue is normal . Neck: no obvious mass . Cardiovascular: S1 S2 normal no gallop . Respiratory: Scattered rhonchi expansion is equal at this time . Abdomen: soft . Skin: no rash seen on limited exam . Musculoskeletal: not rigid . Psychiatric:unable to assess . Neurologic: no seizure no involuntary movements         Lab Data:   Basic Metabolic Panel: Recent Labs  Lab 02/12/18 0651 02/13/18 0903  NA 133* 133*  K 3.4* 3.8  CL 92* 88*  CO2 31 32  GLUCOSE 125* 125*  BUN 11 13  CREATININE 0.54 0.70  CALCIUM 9.1 9.3  MG 1.9  --   PHOS 3.2  --     ABG: No results for input(s): PHART, PCO2ART, PO2ART, HCO3, O2SAT in the last 168 hours.  Liver Function Tests: No results for input(s): AST, ALT, ALKPHOS, BILITOT, PROT, ALBUMIN in the last 168 hours. No results for input(s): LIPASE, AMYLASE in the last 168 hours. No results for input(s): AMMONIA in the last 168 hours.  CBC: Recent Labs  Lab 02/12/18 0651  WBC 5.7  HGB 11.8*  HCT 39.3  MCV 94.7  PLT 310    Cardiac Enzymes: No results for input(s): CKTOTAL, CKMB, CKMBINDEX, TROPONINI in the last 168 hours.  BNP (last 3 results) No results for input(s): BNP in the last 8760  hours.  ProBNP (last 3 results) No results for input(s): PROBNP in the last 8760 hours.  Radiological Exams: No results found.  Assessment/Plan Active Problems:   Acute on chronic respiratory failure with hypoxia (HCC)   Primary pulmonary hypertension (HCC)   Chronic atrial fibrillation   Bilateral pulmonary contusion   Severe sepsis (HCC)   Multiple trauma to chest   1. Acute on chronic respiratory failure with hypoxia we will continue with T collar trials on 35% FiO2 continue pulmonary toilet secretion management.  Goal as mentioned is 20 hours 2. Pulmonary hypertension at baseline we will continue with supportive care 3. Chronic atrial fibrillation rate controlled 4. Severe sepsis hemodynamically stable resolved 5. Chest trauma continue with pain management supportive care clinically stable improving 6. Pulmonary contusion shows improvement clinically tolerating weaning   I have personally seen and evaluated the patient, evaluated laboratory and imaging results, formulated the assessment and plan and placed orders. The Patient requires high complexity decision making for assessment and support.  Case was discussed on Rounds with the Respiratory Therapy Staff  Yevonne Pax, MD [facility] Pulmonary Critical Care Medicine Sleep Medicine

## 2018-02-18 DIAGNOSIS — S27322S Contusion of lung, bilateral, sequela: Secondary | ICD-10-CM | POA: Diagnosis not present

## 2018-02-18 DIAGNOSIS — J9621 Acute and chronic respiratory failure with hypoxia: Secondary | ICD-10-CM | POA: Diagnosis not present

## 2018-02-18 DIAGNOSIS — S299XXS Unspecified injury of thorax, sequela: Secondary | ICD-10-CM | POA: Diagnosis not present

## 2018-02-18 DIAGNOSIS — I482 Chronic atrial fibrillation, unspecified: Secondary | ICD-10-CM | POA: Diagnosis not present

## 2018-02-18 LAB — PROTIME-INR
INR: 1.45
Prothrombin Time: 17.5 seconds — ABNORMAL HIGH (ref 11.4–15.2)

## 2018-02-18 NOTE — Progress Notes (Signed)
Pulmonary Critical Care Medicine [facility] [place]   PULMONARY CRITICAL CARE SERVICE  PROGRESS NOTE  Date of Service: 02/18/2018  Barbara Osborn  [id number]  DOB: [birth year]   DOA: 02/07/2018  Referring Physician: Carron Curie, MD  HPI: Barbara Osborn is a 68 y.o. female seen for follow up of Acute on Chronic Respiratory Failure.  Patient is on 35% FiO2 on T collar goal is 24 hours today  Medications: Reviewed on Rounds  Physical Exam:  Vitals: Temperature 97.1 pulse 74 respiratory 24 blood pressure 120/62 saturation 98%  Ventilator Settings off ventilator on T collar  . General: Comfortable at this time . Eyes: Grossly normal lids, irises & conjunctiva . ENT: grossly tongue is normal . Neck: no obvious mass . Cardiovascular: S1 S2 normal no gallop . Respiratory: Scattered rhonchi expansion is equal . Abdomen: soft . Skin: no rash seen on limited exam . Musculoskeletal: not rigid . Psychiatric:unable to assess . Neurologic: no seizure no involuntary movements         Lab Data:   Basic Metabolic Panel: Recent Labs  Lab 02/12/18 0651 02/13/18 0903  NA 133* 133*  K 3.4* 3.8  CL 92* 88*  CO2 31 32  GLUCOSE 125* 125*  BUN 11 13  CREATININE 0.54 0.70  CALCIUM 9.1 9.3  MG 1.9  --   PHOS 3.2  --     ABG: No results for input(s): PHART, PCO2ART, PO2ART, HCO3, O2SAT in the last 168 hours.  Liver Function Tests: No results for input(s): AST, ALT, ALKPHOS, BILITOT, PROT, ALBUMIN in the last 168 hours. No results for input(s): LIPASE, AMYLASE in the last 168 hours. No results for input(s): AMMONIA in the last 168 hours.  CBC: Recent Labs  Lab 02/12/18 0651  WBC 5.7  HGB 11.8*  HCT 39.3  MCV 94.7  PLT 310    Cardiac Enzymes: No results for input(s): CKTOTAL, CKMB, CKMBINDEX, TROPONINI in the last 168 hours.  BNP (last 3 results) No results for input(s): BNP in the last 8760 hours.  ProBNP (last 3 results) No results for input(s):  PROBNP in the last 8760 hours.  Radiological Exams: No results found.  Assessment/Plan Active Problems:   Acute on chronic respiratory failure with hypoxia (HCC)   Primary pulmonary hypertension (HCC)   Chronic atrial fibrillation   Bilateral pulmonary contusion   Severe sepsis (HCC)   Multiple trauma to chest   1. Acute on chronic respiratory failure with hypoxia we will continue T collar trials as mentioned above the goal is for 24 hours 2. Pulmonary hypertension continue oxygen therapy 3. Chronic atrial fibrillation rate controlled 4. Bilateral pulmonary contusion improving 5. Severe sepsis resolved 6. Chest trauma pain control   I have personally seen and evaluated the patient, evaluated laboratory and imaging results, formulated the assessment and plan and placed orders. The Patient requires high complexity decision making for assessment and support.  Case was discussed on Rounds with the Respiratory Therapy Staff  Yevonne Pax, MD [facility] Pulmonary Critical Care Medicine Sleep Medicine

## 2018-02-19 DIAGNOSIS — S299XXS Unspecified injury of thorax, sequela: Secondary | ICD-10-CM | POA: Diagnosis not present

## 2018-02-19 DIAGNOSIS — I482 Chronic atrial fibrillation, unspecified: Secondary | ICD-10-CM | POA: Diagnosis not present

## 2018-02-19 DIAGNOSIS — J9621 Acute and chronic respiratory failure with hypoxia: Secondary | ICD-10-CM | POA: Diagnosis not present

## 2018-02-19 DIAGNOSIS — S27322S Contusion of lung, bilateral, sequela: Secondary | ICD-10-CM | POA: Diagnosis not present

## 2018-02-19 LAB — PROTIME-INR
INR: 1.96
Prothrombin Time: 22.1 seconds — ABNORMAL HIGH (ref 11.4–15.2)

## 2018-02-19 NOTE — Progress Notes (Addendum)
Pulmonary Critical Care Medicine [facility] [place]   PULMONARY CRITICAL CARE SERVICE  PROGRESS NOTE  Date of Service: 02/19/2018  Barbara Osborn  [id number]  DOB: 11/25/1950   DOA: 02/07/2018  Referring Physician: Carron Curie, MD  HPI: Barbara Osborn is a 68 y.o. female seen for follow up of Acute on Chronic Respiratory Failure.  Patient doing well on trach collar FiO2 40% currently.  She is on trach collar for 24 hours and the goal continues today at 48 hours.  Patient is currently doing well.  Medications: Reviewed on Rounds  Physical Exam:  Vitals: Pulse 95 respirations 22 BP 121/67 O2 sat 97% temp 97.6  Ventilator Settings patient's not currently on [place]  . General: Comfortable at this time . Eyes: Grossly normal lids, irises & conjunctiva . ENT: grossly tongue is normal . Neck: no obvious mass . Cardiovascular: S1 S2 normal no gallop . Respiratory: Coarse breath sounds noted . Abdomen: soft . Skin: no rash seen on limited exam . Musculoskeletal: not rigid . Psychiatric:unable to assess . Neurologic: no seizure no involuntary movements         Lab Data:   Basic Metabolic Panel: Recent Labs  Lab 02/13/18 0903  NA 133*  K 3.8  CL 88*  CO2 32  GLUCOSE 125*  BUN 13  CREATININE 0.70  CALCIUM 9.3    ABG: No results for input(s): PHART, PCO2ART, PO2ART, HCO3, O2SAT in the last 168 hours.  Liver Function Tests: No results for input(s): AST, ALT, ALKPHOS, BILITOT, PROT, ALBUMIN in the last 168 hours. No results for input(s): LIPASE, AMYLASE in the last 168 hours. No results for input(s): AMMONIA in the last 168 hours.  CBC: No results for input(s): WBC, NEUTROABS, HGB, HCT, MCV, PLT in the last 168 hours.  Cardiac Enzymes: No results for input(s): CKTOTAL, CKMB, CKMBINDEX, TROPONINI in the last 168 hours.  BNP (last 3 results) No results for input(s): BNP in the last 8760 hours.  ProBNP (last 3 results) No results for input(s):  PROBNP in the last 8760 hours.  Radiological Exams: No results found.  Assessment/Plan Active Problems:   Acute on chronic respiratory failure with hypoxia (HCC)   Primary pulmonary hypertension (HCC)   Chronic atrial fibrillation   Bilateral pulmonary contusion   Severe sepsis (HCC)   Multiple trauma to chest   1. Acute on chronic respiratory failure with hypoxia continue trach collar trial mentioned above goal today is 48 hours. 2. Pulmonary hypertension continue oxygen therapy 3. Chronic atrial fibrillation rate controlled 4. Bilateral pulmonary contusion improving 5. Severe sepsis resolved 6. Chest trauma pain control   I have personally seen and evaluated the patient, evaluated laboratory and imaging results, formulated the assessment and plan and placed orders. The Patient requires high complexity decision making for assessment and support.  Case was discussed on Rounds with the Respiratory Therapy Staff  Yevonne Pax, MD [facility] Pulmonary Critical Care Medicine Sleep Medicine

## 2018-02-20 ENCOUNTER — Other Ambulatory Visit (HOSPITAL_COMMUNITY): Payer: Self-pay

## 2018-02-20 DIAGNOSIS — I482 Chronic atrial fibrillation, unspecified: Secondary | ICD-10-CM | POA: Diagnosis not present

## 2018-02-20 DIAGNOSIS — S299XXS Unspecified injury of thorax, sequela: Secondary | ICD-10-CM | POA: Diagnosis not present

## 2018-02-20 DIAGNOSIS — J9621 Acute and chronic respiratory failure with hypoxia: Secondary | ICD-10-CM | POA: Diagnosis not present

## 2018-02-20 DIAGNOSIS — S27322S Contusion of lung, bilateral, sequela: Secondary | ICD-10-CM | POA: Diagnosis not present

## 2018-02-20 LAB — CBC
HCT: 47.1 % — ABNORMAL HIGH (ref 36.0–46.0)
Hemoglobin: 14.1 g/dL (ref 12.0–15.0)
MCH: 28.7 pg (ref 26.0–34.0)
MCHC: 29.9 g/dL — ABNORMAL LOW (ref 30.0–36.0)
MCV: 95.7 fL (ref 80.0–100.0)
Platelets: 336 10*3/uL (ref 150–400)
RBC: 4.92 MIL/uL (ref 3.87–5.11)
RDW: 18 % — AB (ref 11.5–15.5)
WBC: 7.8 10*3/uL (ref 4.0–10.5)
nRBC: 0 % (ref 0.0–0.2)

## 2018-02-20 LAB — BASIC METABOLIC PANEL
Anion gap: 12 (ref 5–15)
BUN: 19 mg/dL (ref 8–23)
CO2: 37 mmol/L — ABNORMAL HIGH (ref 22–32)
CREATININE: 0.76 mg/dL (ref 0.44–1.00)
Calcium: 9.4 mg/dL (ref 8.9–10.3)
Chloride: 89 mmol/L — ABNORMAL LOW (ref 98–111)
GFR calc Af Amer: >60 mL/min (ref >60)
Glucose, Bld: 120 mg/dL — ABNORMAL HIGH (ref 70–99)
Potassium: 3.1 mmol/L — ABNORMAL LOW (ref 3.5–5.1)
Sodium: 138 mmol/L (ref 135–145)

## 2018-02-20 LAB — PROTIME-INR
INR: 2.12
Prothrombin Time: 23.4 seconds — ABNORMAL HIGH (ref 11.4–15.2)

## 2018-02-20 NOTE — Progress Notes (Addendum)
Pulmonary Critical Care Medicine [facility] [place]   PULMONARY CRITICAL CARE SERVICE  PROGRESS NOTE  Date of Service: 02/20/2018  Barbara Osborn  [id number]  DOB: [birth year]   DOA: 02/07/2018  Referring Physician: Carron Curie, MD  HPI: Barbara Osborn is a 68 y.o. female seen for follow up of Acute on Chronic Respiratory Failure.  Patient is doing well on trach collar FiO2 40% for more than 48 hours now.  No acute distress is noted and her trach is changed with #4 cuffless and capping trials can begin.  Medications: Reviewed on Rounds  Physical Exam:  Vitals: Pulse 83 respirations 20 BP 125/72 O2 sat 97% temp 96.7  Ventilator Settings patient's not currently on ventilator  . General: Comfortable at this time . Eyes: Grossly normal lids, irises & conjunctiva . ENT: grossly tongue is normal . Neck: no obvious mass . Cardiovascular: S1 S2 normal no gallop . Respiratory: Coarse breath sounds . Abdomen: soft . Skin: no rash seen on limited exam . Musculoskeletal: not rigid . Psychiatric:unable to assess . Neurologic: no seizure no involuntary movements         Lab Data:   Basic Metabolic Panel: Recent Labs  Lab 02/20/18 0607  NA 138  K 3.1*  CL 89*  CO2 37*  GLUCOSE 120*  BUN 19  CREATININE 0.76  CALCIUM 9.4    ABG: No results for input(s): PHART, PCO2ART, PO2ART, HCO3, O2SAT in the last 168 hours.  Liver Function Tests: No results for input(s): AST, ALT, ALKPHOS, BILITOT, PROT, ALBUMIN in the last 168 hours. No results for input(s): LIPASE, AMYLASE in the last 168 hours. No results for input(s): AMMONIA in the last 168 hours.  CBC: Recent Labs  Lab 02/20/18 0607  WBC 7.8  HGB 14.1  HCT 47.1*  MCV 95.7  PLT 336    Cardiac Enzymes: No results for input(s): CKTOTAL, CKMB, CKMBINDEX, TROPONINI in the last 168 hours.  BNP (last 3 results) No results for input(s): BNP in the last 8760 hours.  ProBNP (last 3 results) No results for  input(s): PROBNP in the last 8760 hours.  Radiological Exams: No results found.  Assessment/Plan Active Problems:   Acute on chronic respiratory failure with hypoxia (HCC)   Primary pulmonary hypertension (HCC)   Chronic atrial fibrillation   Bilateral pulmonary contusion   Severe sepsis (HCC)   Multiple trauma to chest   1. Acute on chronic respiratory failure with hypoxia continue with trach collar at this time.  Trach change #4 cuffless and capping trials may begin. 2. Pulmonary hypertension continue oxygen therapy 3. Chronic atrial fibrillation rate controlled 4. Bilateral pulmonary contusion improving 5. Severe sepsis resolved 6. Chest trauma pain control   I have personally seen and evaluated the patient, evaluated laboratory and imaging results, formulated the assessment and plan and placed orders. The Patient requires high complexity decision making for assessment and support.  Case was discussed on Rounds with the Respiratory Therapy Staff  Yevonne Pax, MD [facility] Pulmonary Critical Care Medicine Sleep Medicine

## 2018-02-21 DIAGNOSIS — S299XXS Unspecified injury of thorax, sequela: Secondary | ICD-10-CM | POA: Diagnosis not present

## 2018-02-21 DIAGNOSIS — J9621 Acute and chronic respiratory failure with hypoxia: Secondary | ICD-10-CM | POA: Diagnosis not present

## 2018-02-21 DIAGNOSIS — I482 Chronic atrial fibrillation, unspecified: Secondary | ICD-10-CM | POA: Diagnosis not present

## 2018-02-21 DIAGNOSIS — S27322S Contusion of lung, bilateral, sequela: Secondary | ICD-10-CM | POA: Diagnosis not present

## 2018-02-21 LAB — PROTIME-INR
INR: 2.33
Prothrombin Time: 25.2 seconds — ABNORMAL HIGH (ref 11.4–15.2)

## 2018-02-21 NOTE — Progress Notes (Signed)
Pulmonary Critical Care Medicine [facility] [place]   PULMONARY CRITICAL CARE SERVICE  PROGRESS NOTE  Date of Service: 02/21/2018  MEL MALCOM  EVO:[id number]  DOB: May 28, 1950   DOA: 02/07/2018  Referring Physician: Carron Curie, MD  HPI: TEAGHAN KOSTRZEWSKI is a 68 y.o. female seen for follow up of Acute on Chronic Respiratory Failure.  Patient is capping looks good right now is on 2 L  Medications: Reviewed on Rounds  Physical Exam:  Vitals: Temperature 97.1 pulse 77 respiratory 24 blood pressure 121/63 saturations 97%  Ventilator Settings capping off the ventilator  . General: Comfortable at this time . Eyes: Grossly normal lids, irises & conjunctiva . ENT: grossly tongue is normal . Neck: no obvious mass . Cardiovascular: S1 S2 normal no gallop . Respiratory: No rhonchi or rales are noted at this time . Abdomen: soft . Skin: no rash seen on limited exam . Musculoskeletal: not rigid . Psychiatric:unable to assess . Neurologic: no seizure no involuntary movements         Lab Data:   Basic Metabolic Panel: Recent Labs  Lab 02/20/18 0607  NA 138  K 3.1*  CL 89*  CO2 37*  GLUCOSE 120*  BUN 19  CREATININE 0.76  CALCIUM 9.4    ABG: No results for input(s): PHART, PCO2ART, PO2ART, HCO3, O2SAT in the last 168 hours.  Liver Function Tests: No results for input(s): AST, ALT, ALKPHOS, BILITOT, PROT, ALBUMIN in the last 168 hours. No results for input(s): LIPASE, AMYLASE in the last 168 hours. No results for input(s): AMMONIA in the last 168 hours.  CBC: Recent Labs  Lab 02/20/18 0607  WBC 7.8  HGB 14.1  HCT 47.1*  MCV 95.7  PLT 336    Cardiac Enzymes: No results for input(s): CKTOTAL, CKMB, CKMBINDEX, TROPONINI in the last 168 hours.  BNP (last 3 results) No results for input(s): BNP in the last 8760 hours.  ProBNP (last 3 results) No results for input(s): PROBNP in the last 8760 hours.  Radiological Exams: Dg Humerus Left  Result  Date: 02/20/2018 CLINICAL DATA:  Left distal humerus pain x4 days, no known injury EXAM: LEFT HUMERUS - 2+ VIEW COMPARISON:  None. FINDINGS: No fracture or dislocation is seen. The joint spaces are preserved. Visualized soft tissues are within normal limits. Left posterolateral 2nd through 7th rib fractures. IMPRESSION: No fracture or dislocation involving the humerus. Left posterolateral 2nd through 7th rib fractures. Electronically Signed   By: Charline Bills M.D.   On: 02/20/2018 15:45    Assessment/Plan Active Problems:   Acute on chronic respiratory failure with hypoxia (HCC)   Primary pulmonary hypertension (HCC)   Chronic atrial fibrillation   Bilateral pulmonary contusion   Severe sepsis (HCC)   Multiple trauma to chest   1. Acute on chronic respiratory failure with hypoxia continue with the capping trials titrate oxygen as necessary the goal is 24 hours 2. Pulmonary hypertension continue oxygen therapy 3. Chronic atrial fibrillation rate controlled 4. Pulmonary contusion improving 5. Chest trauma slow to improve 6. Severe sepsis hemodynamically stable resolved   I have personally seen and evaluated the patient, evaluated laboratory and imaging results, formulated the assessment and plan and placed orders. The Patient requires high complexity decision making for assessment and support.  Case was discussed on Rounds with the Respiratory Therapy Staff  Yevonne Pax, MD [facility] Pulmonary Critical Care Medicine Sleep Medicine

## 2018-02-22 ENCOUNTER — Other Ambulatory Visit (HOSPITAL_COMMUNITY): Payer: Self-pay

## 2018-02-22 DIAGNOSIS — S299XXS Unspecified injury of thorax, sequela: Secondary | ICD-10-CM | POA: Diagnosis not present

## 2018-02-22 DIAGNOSIS — I482 Chronic atrial fibrillation, unspecified: Secondary | ICD-10-CM | POA: Diagnosis not present

## 2018-02-22 DIAGNOSIS — J9621 Acute and chronic respiratory failure with hypoxia: Secondary | ICD-10-CM | POA: Diagnosis not present

## 2018-02-22 DIAGNOSIS — S27322S Contusion of lung, bilateral, sequela: Secondary | ICD-10-CM | POA: Diagnosis not present

## 2018-02-22 LAB — PROTIME-INR
INR: 2.2
PROTHROMBIN TIME: 24.2 s — AB (ref 11.4–15.2)

## 2018-02-22 LAB — POTASSIUM: Potassium: 3.9 mmol/L (ref 3.5–5.1)

## 2018-02-22 NOTE — Progress Notes (Signed)
Pulmonary Critical Care Medicine [facility] [place]   PULMONARY CRITICAL CARE SERVICE  PROGRESS NOTE  Date of Service: 02/22/2018  Barbara Osborn  [id number]:[id number]  DOB: 04-Jan-1951   DOA: 02/07/2018  Referring Physician: Carron Curie, MD  HPI: Barbara Osborn is a 68 y.o. female seen for follow up of Acute on Chronic Respiratory Failure.  Patient is capping on 2 L today should be the 48-hour mark  Medications: Reviewed on Rounds  Physical Exam:  Vitals: Temperature 97.0 pulse 85 respiratory 18 blood pressure 125/75 saturation 94%  Ventilator Settings capping of the ventilator on 2 L  . General: Comfortable at this time . Eyes: Grossly normal lids, irises & conjunctiva . ENT: grossly tongue is normal . Neck: no obvious mass . Cardiovascular: S1 S2 normal no gallop . Respiratory: No rhonchi or rales are noted . Abdomen: soft . Skin: no rash seen on limited exam . Musculoskeletal: not rigid . Psychiatric:unable to assess . Neurologic: no seizure no involuntary movements         Lab Data:   Basic Metabolic Panel: Recent Labs  Lab 02/20/18 0607  NA 138  K 3.1*  CL 89*  CO2 37*  GLUCOSE 120*  BUN 19  CREATININE 0.76  CALCIUM 9.4    ABG: No results for input(s): PHART, PCO2ART, PO2ART, HCO3, O2SAT in the last 168 hours.  Liver Function Tests: No results for input(s): AST, ALT, ALKPHOS, BILITOT, PROT, ALBUMIN in the last 168 hours. No results for input(s): LIPASE, AMYLASE in the last 168 hours. No results for input(s): AMMONIA in the last 168 hours.  CBC: Recent Labs  Lab 02/20/18 0607  WBC 7.8  HGB 14.1  HCT 47.1*  MCV 95.7  PLT 336    Cardiac Enzymes: No results for input(s): CKTOTAL, CKMB, CKMBINDEX, TROPONINI in the last 168 hours.  BNP (last 3 results) No results for input(s): BNP in the last 8760 hours.  ProBNP (last 3 results) No results for input(s): PROBNP in the last 8760 hours.  Radiological Exams: No results  found.  Assessment/Plan Active Problems:   Acute on chronic respiratory failure with hypoxia (HCC)   Primary pulmonary hypertension (HCC)   Chronic atrial fibrillation   Bilateral pulmonary contusion   Severe sepsis (HCC)   Multiple trauma to chest   1. Acute on chronic respiratory failure with hypoxia we will continue with capping trials patient should be able to complete 48 hours today 2. Primary pulmonary hypertension on oxygen therapy 3. Chronic atrial fibrillation rate controlled 4. Bilateral pulmonary contusion treated we will continue to monitor 5. Severe sepsis hemodynamically stable 6. Multiple chest trauma slowly improving we will continue to follow   I have personally seen and evaluated the patient, evaluated laboratory and imaging results, formulated the assessment and plan and placed orders. The Patient requires high complexity decision making for assessment and support.  Case was discussed on Rounds with the Respiratory Therapy Staff  Yevonne Pax, MD [facility] Pulmonary Critical Care Medicine Sleep Medicine

## 2018-02-23 DIAGNOSIS — S27322S Contusion of lung, bilateral, sequela: Secondary | ICD-10-CM | POA: Diagnosis not present

## 2018-02-23 DIAGNOSIS — I482 Chronic atrial fibrillation, unspecified: Secondary | ICD-10-CM | POA: Diagnosis not present

## 2018-02-23 DIAGNOSIS — S299XXS Unspecified injury of thorax, sequela: Secondary | ICD-10-CM | POA: Diagnosis not present

## 2018-02-23 DIAGNOSIS — J9621 Acute and chronic respiratory failure with hypoxia: Secondary | ICD-10-CM | POA: Diagnosis not present

## 2018-02-23 LAB — PROTIME-INR
INR: 2.13
Prothrombin Time: 23.5 seconds — ABNORMAL HIGH (ref 11.4–15.2)

## 2018-02-23 NOTE — Progress Notes (Addendum)
Pulmonary Critical Care Medicine [facility] [place]   PULMONARY CRITICAL CARE SERVICE  PROGRESS NOTE  Date of Service: 02/23/2018  Barbara Osborn  [id number]  DOB: 03-11-1950   DOA: 02/07/2018  Referring Physician: Carron Curie, MD  HPI: Barbara Osborn is a 68 y.o. female seen for follow up of Acute on Chronic Respiratory Failure.  Patient has reached the 72-hour mark of capping on 2 L via nasal cannula.  Most likely plan to decannulate tomorrow if patient does well overnight.  She had minimal secretions today and is tolerating speaking valve.  Medications: Reviewed on Rounds  Physical Exam:  Vitals: Pulse 87 respirations 24 BP 116/66 O2 sat 95% temp 97.5  Ventilator Settings patient not currently on ventilator  . General: Comfortable at this time . Eyes: Grossly normal lids, irises & conjunctiva . ENT: grossly tongue is normal . Neck: no obvious mass . Cardiovascular: S1 S2 normal no gallop . Respiratory: No rales or rhonchi noted . Abdomen: soft . Skin: no rash seen on limited exam . Musculoskeletal: not rigid . Psychiatric:unable to assess . Neurologic: no seizure no involuntary movements         Lab Data:   Basic Metabolic Panel: Recent Labs  Lab 02/20/18 0607 02/22/18 1457  NA 138  --   K 3.1* 3.9  CL 89*  --   CO2 37*  --   GLUCOSE 120*  --   BUN 19  --   CREATININE 0.76  --   CALCIUM 9.4  --     ABG: No results for input(s): PHART, PCO2ART, PO2ART, HCO3, O2SAT in the last 168 hours.  Liver Function Tests: No results for input(s): AST, ALT, ALKPHOS, BILITOT, PROT, ALBUMIN in the last 168 hours. No results for input(s): LIPASE, AMYLASE in the last 168 hours. No results for input(s): AMMONIA in the last 168 hours.  CBC: Recent Labs  Lab 02/20/18 0607  WBC 7.8  HGB 14.1  HCT 47.1*  MCV 95.7  PLT 336    Cardiac Enzymes: No results for input(s): CKTOTAL, CKMB, CKMBINDEX, TROPONINI in the last 168 hours.  BNP (last 3  results) No results for input(s): BNP in the last 8760 hours.  ProBNP (last 3 results) No results for input(s): PROBNP in the last 8760 hours.  Radiological Exams: No results found.  Assessment/Plan Active Problems:   Acute on chronic respiratory failure with hypoxia (HCC)   Primary pulmonary hypertension (HCC)   Chronic atrial fibrillation   Bilateral pulmonary contusion   Severe sepsis (HCC)   Multiple trauma to chest   1. Acute on chronic respiratory failure with hypoxia continue capping trials and plan for mostly decannulation tomorrow. 2. Primary pulmonary hypertension on oxygen therapy 3. Chronic atrial fibrillation rate controlled 4. Bilateral pulmonary contusion treated continue to monitor 5. Severe sepsis hemodynamically stable 6. Multiple chest trauma slowly improving continue follow   I have personally seen and evaluated the patient, evaluated laboratory and imaging results, formulated the assessment and plan and placed orders. The Patient requires high complexity decision making for assessment and support.  Case was discussed on Rounds with the Respiratory Therapy Staff  Yevonne Pax, MD [facility] Pulmonary Critical Care Medicine Sleep Medicine

## 2018-02-24 DIAGNOSIS — S299XXS Unspecified injury of thorax, sequela: Secondary | ICD-10-CM | POA: Diagnosis not present

## 2018-02-24 DIAGNOSIS — I482 Chronic atrial fibrillation, unspecified: Secondary | ICD-10-CM | POA: Diagnosis not present

## 2018-02-24 DIAGNOSIS — S27322S Contusion of lung, bilateral, sequela: Secondary | ICD-10-CM | POA: Diagnosis not present

## 2018-02-24 DIAGNOSIS — J9621 Acute and chronic respiratory failure with hypoxia: Secondary | ICD-10-CM | POA: Diagnosis not present

## 2018-02-24 LAB — PROTIME-INR
INR: 2.54
Prothrombin Time: 27 seconds — ABNORMAL HIGH (ref 11.4–15.2)

## 2018-02-24 NOTE — Progress Notes (Addendum)
Pulmonary Critical Care Medicine [facility] [place]   PULMONARY CRITICAL CARE SERVICE  PROGRESS NOTE  Date of Service: 02/24/2018  Barbara Osborn  [id number]  DOB: December 31, 1950   DOA: 02/07/2018  Referring Physician: Carron Curie, MD  HPI: Barbara Osborn is a 68 y.o. female seen for follow up of Acute on Chronic Respiratory Failure.  Patient was decannulated today successfully.  She is on 2 L via nasal cannula of oxygen at this time.  She continues to have minimal secretions and seems to be maintaining her airway.  Medications: Reviewed on Rounds  Physical Exam:  Vitals: Pulse 76 respirations 19 BP 131/63 O2 sat 97% temp 97.6  Ventilator Settings patient's not currently on ventilator  . General: Comfortable at this time . Eyes: Grossly normal lids, irises & conjunctiva . ENT: grossly tongue is normal . Neck: no obvious mass . Cardiovascular: S1 S2 normal no gallop . Respiratory: No rales or rhonchi noted . Abdomen: soft . Skin: no rash seen on limited exam . Musculoskeletal: not rigid . Psychiatric:unable to assess . Neurologic: no seizure no involuntary movements         Lab Data:   Basic Metabolic Panel: Recent Labs  Lab 02/20/18 0607 02/22/18 1457  NA 138  --   K 3.1* 3.9  CL 89*  --   CO2 37*  --   GLUCOSE 120*  --   BUN 19  --   CREATININE 0.76  --   CALCIUM 9.4  --     ABG: No results for input(s): PHART, PCO2ART, PO2ART, HCO3, O2SAT in the last 168 hours.  Liver Function Tests: No results for input(s): AST, ALT, ALKPHOS, BILITOT, PROT, ALBUMIN in the last 168 hours. No results for input(s): LIPASE, AMYLASE in the last 168 hours. No results for input(s): AMMONIA in the last 168 hours.  CBC: Recent Labs  Lab 02/20/18 0607  WBC 7.8  HGB 14.1  HCT 47.1*  MCV 95.7  PLT 336    Cardiac Enzymes: No results for input(s): CKTOTAL, CKMB, CKMBINDEX, TROPONINI in the last 168 hours.  BNP (last 3 results) No results for input(s):  BNP in the last 8760 hours.  ProBNP (last 3 results) No results for input(s): PROBNP in the last 8760 hours.  Radiological Exams: No results found.  Assessment/Plan Active Problems:   Acute on chronic respiratory failure with hypoxia (HCC)   Primary pulmonary hypertension (HCC)   Chronic atrial fibrillation   Bilateral pulmonary contusion   Severe sepsis (HCC)   Multiple trauma to chest   1. Acute on chronic respiratory failure with hypoxia patient decannulated today and is doing well at this time.  Continue supportive measures 2. Primary pulmonary hypertension on oxygen therapy 3. Chronic atrial fibrillation rate controlled 4. Bilateral pulmonary contusion treated continue to monitor 5. Severe sepsis hemodynamically stable 6. Multiple chest trauma slowly improving continue to follow   I have personally seen and evaluated the patient, evaluated laboratory and imaging results, formulated the assessment and plan and placed orders. The Patient requires high complexity decision making for assessment and support.  Case was discussed on Rounds with the Respiratory Therapy Staff  Yevonne Pax, MD [facility] Pulmonary Critical Care Medicine Sleep Medicine

## 2018-02-25 DIAGNOSIS — S27322S Contusion of lung, bilateral, sequela: Secondary | ICD-10-CM | POA: Diagnosis not present

## 2018-02-25 DIAGNOSIS — I482 Chronic atrial fibrillation, unspecified: Secondary | ICD-10-CM | POA: Diagnosis not present

## 2018-02-25 DIAGNOSIS — J9621 Acute and chronic respiratory failure with hypoxia: Secondary | ICD-10-CM | POA: Diagnosis not present

## 2018-02-25 DIAGNOSIS — S299XXS Unspecified injury of thorax, sequela: Secondary | ICD-10-CM | POA: Diagnosis not present

## 2018-02-25 LAB — BASIC METABOLIC PANEL
Anion gap: 13 (ref 5–15)
BUN: 14 mg/dL (ref 8–23)
CO2: 36 mmol/L — ABNORMAL HIGH (ref 22–32)
Calcium: 9.3 mg/dL (ref 8.9–10.3)
Chloride: 86 mmol/L — ABNORMAL LOW (ref 98–111)
Creatinine, Ser: 0.69 mg/dL (ref 0.44–1.00)
Glucose, Bld: 105 mg/dL — ABNORMAL HIGH (ref 70–99)
Potassium: 3 mmol/L — ABNORMAL LOW (ref 3.5–5.1)
SODIUM: 135 mmol/L (ref 135–145)

## 2018-02-25 LAB — CBC
HCT: 47 % — ABNORMAL HIGH (ref 36.0–46.0)
Hemoglobin: 14.6 g/dL (ref 12.0–15.0)
MCH: 29.3 pg (ref 26.0–34.0)
MCHC: 31.1 g/dL (ref 30.0–36.0)
MCV: 94.4 fL (ref 80.0–100.0)
Platelets: 270 10*3/uL (ref 150–400)
RBC: 4.98 MIL/uL (ref 3.87–5.11)
RDW: 17.2 % — ABNORMAL HIGH (ref 11.5–15.5)
WBC: 7.9 10*3/uL (ref 4.0–10.5)
nRBC: 0 % (ref 0.0–0.2)

## 2018-02-25 LAB — PROTIME-INR
INR: 2.08
Prothrombin Time: 23.1 seconds — ABNORMAL HIGH (ref 11.4–15.2)

## 2018-02-25 LAB — MAGNESIUM: Magnesium: 1.8 mg/dL (ref 1.7–2.4)

## 2018-02-25 NOTE — Progress Notes (Addendum)
Pulmonary Critical Care Medicine [facility] [place]   PULMONARY CRITICAL CARE SERVICE  PROGRESS NOTE  Date of Service: 02/25/2018  Barbara Osborn  ASN:[id number]  DOB: [birth year]   DOA: 02/07/2018  Referring Physician: Carron Curie, MD  HPI: Barbara Osborn is a 68 y.o. female seen for follow up of Acute on Chronic Respiratory Failure.  Patient was decannulated successfully yesterday.  She continues on 2 L of oxygen via nasal cannula.  She did need her oxygen increased to 4 L while she was sleeping most likely due to history of OSA.  Plan is to have BiPAP in room tonight.  Medications: Reviewed on Rounds  Physical Exam:  Vitals: Pulse 77 respirations 14 BP 120/71 O2 sat 97% temp 98.0  Ventilator Settings patient's not currently on ventilator  . General: Comfortable at this time . Eyes: Grossly normal lids, irises & conjunctiva . ENT: grossly tongue is normal . Neck: no obvious mass . Cardiovascular: S1 S2 normal no gallop . Respiratory: No rales or rhonchi noted . Abdomen: soft . Skin: no rash seen on limited exam . Musculoskeletal: not rigid . Psychiatric:unable to assess . Neurologic: no seizure no involuntary movements         Lab Data:   Basic Metabolic Panel: Recent Labs  Lab 02/20/18 0607 02/22/18 1457 02/25/18 0436  NA 138  --  135  K 3.1* 3.9 3.0*  CL 89*  --  86*  CO2 37*  --  36*  GLUCOSE 120*  --  105*  BUN 19  --  14  CREATININE 0.76  --  0.69  CALCIUM 9.4  --  9.3  MG  --   --  1.8    ABG: No results for input(s): PHART, PCO2ART, PO2ART, HCO3, O2SAT in the last 168 hours.  Liver Function Tests: No results for input(s): AST, ALT, ALKPHOS, BILITOT, PROT, ALBUMIN in the last 168 hours. No results for input(s): LIPASE, AMYLASE in the last 168 hours. No results for input(s): AMMONIA in the last 168 hours.  CBC: Recent Labs  Lab 02/20/18 0607 02/25/18 0436  WBC 7.8 7.9  HGB 14.1 14.6  HCT 47.1* 47.0*  MCV 95.7 94.4  PLT 336  270    Cardiac Enzymes: No results for input(s): CKTOTAL, CKMB, CKMBINDEX, TROPONINI in the last 168 hours.  BNP (last 3 results) No results for input(s): BNP in the last 8760 hours.  ProBNP (last 3 results) No results for input(s): PROBNP in the last 8760 hours.  Radiological Exams: No results found.  Assessment/Plan Active Problems:   Acute on chronic respiratory failure with hypoxia (HCC)   Primary pulmonary hypertension (HCC)   Chronic atrial fibrillation   Bilateral pulmonary contusion   Severe sepsis (HCC)   Multiple trauma to chest   1. Acute on chronic respiratory failure with hypoxia patient decannulated yesterday doing well at this time.  Continue supportive measures 2. Primary pulmonary hypertension on oxygen therapy 3. Chronic atrial fibrillation rate controlled 4. Bilateral pulmonary contusion treated continue to monitor 5. Severe sepsis hemodynamically stable 6. Multiple chest trauma slowly improving continue to follow   I have personally seen and evaluated the patient, evaluated laboratory and imaging results, formulated the assessment and plan and placed orders. The Patient requires high complexity decision making for assessment and support.  Case was discussed on Rounds with the Respiratory Therapy Staff  Yevonne Pax, MD [facility] Pulmonary Critical Care Medicine Sleep Medicine

## 2018-02-26 DIAGNOSIS — J9621 Acute and chronic respiratory failure with hypoxia: Secondary | ICD-10-CM | POA: Diagnosis not present

## 2018-02-26 DIAGNOSIS — S299XXS Unspecified injury of thorax, sequela: Secondary | ICD-10-CM | POA: Diagnosis not present

## 2018-02-26 DIAGNOSIS — I482 Chronic atrial fibrillation, unspecified: Secondary | ICD-10-CM | POA: Diagnosis not present

## 2018-02-26 DIAGNOSIS — S27322S Contusion of lung, bilateral, sequela: Secondary | ICD-10-CM | POA: Diagnosis not present

## 2018-02-26 LAB — PROTIME-INR
INR: 2.01
Prothrombin Time: 22.5 seconds — ABNORMAL HIGH (ref 11.4–15.2)

## 2018-02-26 LAB — POTASSIUM: Potassium: 3.3 mmol/L — ABNORMAL LOW (ref 3.5–5.1)

## 2018-02-26 NOTE — Progress Notes (Addendum)
Pulmonary Critical Care Medicine [facility] [place]   PULMONARY CRITICAL CARE SERVICE  PROGRESS NOTE  Date of Service: 02/26/2018  Barbara Osborn  [id number]  DOB: 1950/12/05   DOA: 02/07/2018  Referring Physician: Carron Curie, MD  HPI: Barbara Osborn is a 68 y.o. female seen for follow up of Acute on Chronic Respiratory Failure.  Patient remains decannulated on 2 L of oxygen via nasal cannula.  No acute distress noted this time.  Medications: Reviewed on Rounds  Physical Exam:  Vitals: Pulse 74 ration 16 BP 122/71 O2 sat 99% temp 90.2  Ventilator Settings patient not currently on ventilator  . General: Comfortable at this time . Eyes: Grossly normal lids, irises & conjunctiva . ENT: grossly tongue is normal . Neck: no obvious mass . Cardiovascular: S1 S2 normal no gallop . Respiratory: No rales or rhonchi noted . Abdomen: soft . Skin: no rash seen on limited exam . Musculoskeletal: not rigid . Psychiatric:unable to assess . Neurologic: no seizure no involuntary movements         Lab Data:   Basic Metabolic Panel: Recent Labs  Lab 02/20/18 0607 02/22/18 1457 02/25/18 0436 02/26/18 0906  NA 138  --  135  --   K 3.1* 3.9 3.0* 3.3*  CL 89*  --  86*  --   CO2 37*  --  36*  --   GLUCOSE 120*  --  105*  --   BUN 19  --  14  --   CREATININE 0.76  --  0.69  --   CALCIUM 9.4  --  9.3  --   MG  --   --  1.8  --     ABG: No results for input(s): PHART, PCO2ART, PO2ART, HCO3, O2SAT in the last 168 hours.  Liver Function Tests: No results for input(s): AST, ALT, ALKPHOS, BILITOT, PROT, ALBUMIN in the last 168 hours. No results for input(s): LIPASE, AMYLASE in the last 168 hours. No results for input(s): AMMONIA in the last 168 hours.  CBC: Recent Labs  Lab 02/20/18 0607 02/25/18 0436  WBC 7.8 7.9  HGB 14.1 14.6  HCT 47.1* 47.0*  MCV 95.7 94.4  PLT 336 270    Cardiac Enzymes: No results for input(s): CKTOTAL, CKMB, CKMBINDEX, TROPONINI  in the last 168 hours.  BNP (last 3 results) No results for input(s): BNP in the last 8760 hours.  ProBNP (last 3 results) No results for input(s): PROBNP in the last 8760 hours.  Radiological Exams: No results found.  Assessment/Plan Active Problems:   Acute on chronic respiratory failure with hypoxia (HCC)   Primary pulmonary hypertension (HCC)   Chronic atrial fibrillation   Bilateral pulmonary contusion   Severe sepsis (HCC)   Multiple trauma to chest   1. Acute on chronic respiratory failure with hypoxia patient remains decannulated on 2 L of oxygen via nasal cannula.  Continue supportive measures. 2. Primary pulmonary hypertension on ox therapy 3. Chronic atrial fibrillation rate controlled 4. Bilateral pulmonary contusion treated continue to monitor 5. Severe sepsis hemodynamically stable 6. Multiple chest trauma slowly improving continue to follow   I have personally seen and evaluated the patient, evaluated laboratory and imaging results, formulated the assessment and plan and placed orders. The Patient requires high complexity decision making for assessment and support.  Case was discussed on Rounds with the Respiratory Therapy Staff  Yevonne Pax, MD [facility] Pulmonary Critical Care Medicine Sleep Medicine

## 2018-02-27 DIAGNOSIS — J9621 Acute and chronic respiratory failure with hypoxia: Secondary | ICD-10-CM | POA: Diagnosis not present

## 2018-02-27 DIAGNOSIS — S27322S Contusion of lung, bilateral, sequela: Secondary | ICD-10-CM | POA: Diagnosis not present

## 2018-02-27 DIAGNOSIS — I482 Chronic atrial fibrillation, unspecified: Secondary | ICD-10-CM | POA: Diagnosis not present

## 2018-02-27 DIAGNOSIS — S299XXS Unspecified injury of thorax, sequela: Secondary | ICD-10-CM | POA: Diagnosis not present

## 2018-02-27 LAB — PROTIME-INR
INR: 1.84
Prothrombin Time: 21 seconds — ABNORMAL HIGH (ref 11.4–15.2)

## 2018-02-27 LAB — POTASSIUM: Potassium: 4 mmol/L (ref 3.5–5.1)

## 2018-02-27 NOTE — Progress Notes (Addendum)
Pulmonary Critical Care Medicine [facility] [place]   PULMONARY CRITICAL CARE SERVICE  PROGRESS NOTE  Date of Service: 02/27/2018  Barbara Osborn  [id number]:[id number]  DOB: Aug 16, 1950   DOA: 02/07/2018  Referring Physician: Carron Curie, MD  HPI: Barbara Osborn is a 68 y.o. female seen for follow up of Acute on Chronic Respiratory Failure.  Patient continues to do well on 4 L of oxygen via nasal cannula.  No acute distress at this time.  Medications: Reviewed on Rounds  Physical Exam:  Vitals: Pulse 83 respirations 20 BP 126/70 O2 sat 95% temp 98.7  Ventilator Settings patient's not on ventilator.  . General: Comfortable at this time . Eyes: Grossly normal lids, irises & conjunctiva . ENT: grossly tongue is normal . Neck: no obvious mass . Cardiovascular: S1 S2 normal no gallop . Respiratory: No rales or rhonchi noted . Abdomen: soft . Skin: no rash seen on limited exam . Musculoskeletal: not rigid . Psychiatric:unable to assess . Neurologic: no seizure no involuntary movements         Lab Data:   Basic Metabolic Panel: Recent Labs  Lab 02/22/18 1457 02/25/18 0436 02/26/18 0906 02/27/18 0653  NA  --  135  --   --   K 3.9 3.0* 3.3* 4.0  CL  --  86*  --   --   CO2  --  36*  --   --   GLUCOSE  --  105*  --   --   BUN  --  14  --   --   CREATININE  --  0.69  --   --   CALCIUM  --  9.3  --   --   MG  --  1.8  --   --     ABG: No results for input(s): PHART, PCO2ART, PO2ART, HCO3, O2SAT in the last 168 hours.  Liver Function Tests: No results for input(s): AST, ALT, ALKPHOS, BILITOT, PROT, ALBUMIN in the last 168 hours. No results for input(s): LIPASE, AMYLASE in the last 168 hours. No results for input(s): AMMONIA in the last 168 hours.  CBC: Recent Labs  Lab 02/25/18 0436  WBC 7.9  HGB 14.6  HCT 47.0*  MCV 94.4  PLT 270    Cardiac Enzymes: No results for input(s): CKTOTAL, CKMB, CKMBINDEX, TROPONINI in the last 168 hours.  BNP (last 3  results) No results for input(s): BNP in the last 8760 hours.  ProBNP (last 3 results) No results for input(s): PROBNP in the last 8760 hours.  Radiological Exams: No results found.  Assessment/Plan Active Problems:   Acute on chronic respiratory failure with hypoxia (HCC)   Primary pulmonary hypertension (HCC)   Chronic atrial fibrillation   Bilateral pulmonary contusion   Severe sepsis (HCC)   Multiple trauma to chest   1. Acute on chronic respiratory failure with hypoxia patient remains decannulated on 4 L of oxygen via nasal cannula.  Continue supportive measures 2. Primary pulmonary hypertension on oxygen therapy 3. Chronic atrial fibrillation rate controlled 4. Bilateral pulmonary contusion treated continue to monitor 5. Severe sepsis hemodynamically stable 6. Multiple chest trauma slowly improving continue to follow   I have personally seen and evaluated the patient, evaluated laboratory and imaging results, formulated the assessment and plan and placed orders. The Patient requires high complexity decision making for assessment and support.  Case was discussed on Rounds with the Respiratory Therapy Staff  Yevonne Pax, MD [facility] Pulmonary Critical Care Medicine Sleep Medicine

## 2018-02-28 ENCOUNTER — Other Ambulatory Visit (HOSPITAL_COMMUNITY): Payer: Self-pay

## 2018-02-28 DIAGNOSIS — I482 Chronic atrial fibrillation, unspecified: Secondary | ICD-10-CM | POA: Diagnosis not present

## 2018-02-28 DIAGNOSIS — S27322S Contusion of lung, bilateral, sequela: Secondary | ICD-10-CM | POA: Diagnosis not present

## 2018-02-28 DIAGNOSIS — S299XXS Unspecified injury of thorax, sequela: Secondary | ICD-10-CM | POA: Diagnosis not present

## 2018-02-28 DIAGNOSIS — J9621 Acute and chronic respiratory failure with hypoxia: Secondary | ICD-10-CM | POA: Diagnosis not present

## 2018-02-28 LAB — SEDIMENTATION RATE: Sed Rate: 23 mm/hr — ABNORMAL HIGH (ref 0–22)

## 2018-02-28 LAB — BASIC METABOLIC PANEL
Anion gap: 11 (ref 5–15)
BUN: 14 mg/dL (ref 8–23)
CHLORIDE: 90 mmol/L — AB (ref 98–111)
CO2: 35 mmol/L — ABNORMAL HIGH (ref 22–32)
Calcium: 9.2 mg/dL (ref 8.9–10.3)
Creatinine, Ser: 0.65 mg/dL (ref 0.44–1.00)
GFR calc Af Amer: >60 mL/min (ref >60)
GFR calc non Af Amer: >60 mL/min (ref >60)
Glucose, Bld: 105 mg/dL — ABNORMAL HIGH (ref 70–99)
POTASSIUM: 4 mmol/L (ref 3.5–5.1)
Sodium: 136 mmol/L (ref 135–145)

## 2018-02-28 LAB — CBC
HCT: 46.6 % — ABNORMAL HIGH (ref 36.0–46.0)
Hemoglobin: 13.9 g/dL (ref 12.0–15.0)
MCH: 28.6 pg (ref 26.0–34.0)
MCHC: 29.8 g/dL — ABNORMAL LOW (ref 30.0–36.0)
MCV: 95.9 fL (ref 80.0–100.0)
NRBC: 0 % (ref 0.0–0.2)
Platelets: 225 10*3/uL (ref 150–400)
RBC: 4.86 MIL/uL (ref 3.87–5.11)
RDW: 17 % — ABNORMAL HIGH (ref 11.5–15.5)
WBC: 7.3 10*3/uL (ref 4.0–10.5)

## 2018-02-28 LAB — PROTIME-INR
INR: 1.78
Prothrombin Time: 20.5 seconds — ABNORMAL HIGH (ref 11.4–15.2)

## 2018-02-28 LAB — C-REACTIVE PROTEIN: CRP: 1.9 mg/dL — AB (ref <1.0)

## 2018-02-28 NOTE — Progress Notes (Signed)
Pulmonary Critical Care Medicine [facility] [place]   PULMONARY CRITICAL CARE SERVICE  PROGRESS NOTE  Date of Service: 02/28/2018  Barbara Osborn  [id number]  DOB: 1950/05/10   DOA: 02/07/2018  Referring Physician: Carron Curie, MD  HPI: Barbara Osborn is a 68 y.o. female seen for follow up of Acute on Chronic Respiratory Failure.  Patient is currently on 2 L doing fairly well decannulated no distress is noted  Medications: Reviewed on Rounds  Physical Exam:  Vitals: Temperature 97.4 pulse 84 respiratory rate 30 blood pressure 130/74 saturations are 96%  Ventilator Settings currently off the ventilator on T collar  . General: Comfortable at this time . Eyes: Grossly normal lids, irises & conjunctiva . ENT: grossly tongue is normal . Neck: no obvious mass . Cardiovascular: S1 S2 normal no gallop . Respiratory: Scattered rhonchi expansion is equal . Abdomen: soft . Skin: no rash seen on limited exam . Musculoskeletal: not rigid . Psychiatric:unable to assess . Neurologic: no seizure no involuntary movements         Lab Data:   Basic Metabolic Panel: Recent Labs  Lab 02/22/18 1457 02/25/18 0436 02/26/18 0906 02/27/18 0653 02/28/18 0650  NA  --  135  --   --  136  K 3.9 3.0* 3.3* 4.0 4.0  CL  --  86*  --   --  90*  CO2  --  36*  --   --  35*  GLUCOSE  --  105*  --   --  105*  BUN  --  14  --   --  14  CREATININE  --  0.69  --   --  0.65  CALCIUM  --  9.3  --   --  9.2  MG  --  1.8  --   --   --     ABG: No results for input(s): PHART, PCO2ART, PO2ART, HCO3, O2SAT in the last 168 hours.  Liver Function Tests: No results for input(s): AST, ALT, ALKPHOS, BILITOT, PROT, ALBUMIN in the last 168 hours. No results for input(s): LIPASE, AMYLASE in the last 168 hours. No results for input(s): AMMONIA in the last 168 hours.  CBC: Recent Labs  Lab 02/25/18 0436 02/28/18 0650  WBC 7.9 7.3  HGB 14.6 13.9  HCT 47.0* 46.6*  MCV 94.4 95.9  PLT  270 225    Cardiac Enzymes: No results for input(s): CKTOTAL, CKMB, CKMBINDEX, TROPONINI in the last 168 hours.  BNP (last 3 results) No results for input(s): BNP in the last 8760 hours.  ProBNP (last 3 results) No results for input(s): PROBNP in the last 8760 hours.  Radiological Exams: Dg Foot 2 Views Left  Result Date: 02/28/2018 CLINICAL DATA:  Foot pain EXAM: LEFT FOOT - 2 VIEW COMPARISON:  None. FINDINGS: No fracture or malalignment. Fixating screws within the head of the second metatarsal. Small wire cerclage at the first proximal phalanx. Truncated appearance of the fifth proximal phalanx which may be secondary to prior surgery or sequela of remote infection. No radiopaque foreign body. IMPRESSION: No acute osseous abnormality. Postsurgical changes of the first and second digits Electronically Signed   By: Jasmine Pang M.D.   On: 02/28/2018 20:01    Assessment/Plan Active Problems:   Acute on chronic respiratory failure with hypoxia (HCC)   Primary pulmonary hypertension (HCC)   Chronic atrial fibrillation   Bilateral pulmonary contusion   Severe sepsis (HCC)   Multiple trauma to chest   1. Acute on  chronic respiratory failure with hypoxia we will continue with T collar trials continue pulmonary toilet supportive care 2. Primary pulmonary hypertension at baseline we will continue present management 3. Chronic atrial fibrillation rate is controlled 4. Bilateral pulmonary contusions with multiple chest trauma clinically improving we will continue to monitor 5. Severe sepsis resolved   I have personally seen and evaluated the patient, evaluated laboratory and imaging results, formulated the assessment and plan and placed orders. The Patient requires high complexity decision making for assessment and support.  Case was discussed on Rounds with the Respiratory Therapy Staff  Yevonne Pax, MD [facility] Pulmonary Critical Care Medicine Sleep Medicine

## 2018-03-01 DIAGNOSIS — S27322S Contusion of lung, bilateral, sequela: Secondary | ICD-10-CM | POA: Diagnosis not present

## 2018-03-01 DIAGNOSIS — S299XXS Unspecified injury of thorax, sequela: Secondary | ICD-10-CM | POA: Diagnosis not present

## 2018-03-01 DIAGNOSIS — J9621 Acute and chronic respiratory failure with hypoxia: Secondary | ICD-10-CM | POA: Diagnosis not present

## 2018-03-01 DIAGNOSIS — I482 Chronic atrial fibrillation, unspecified: Secondary | ICD-10-CM | POA: Diagnosis not present

## 2018-03-01 LAB — PROTIME-INR
INR: 2.29
Prothrombin Time: 24.9 seconds — ABNORMAL HIGH (ref 11.4–15.2)

## 2018-03-01 LAB — BASIC METABOLIC PANEL
Anion gap: 12 (ref 5–15)
BUN: 12 mg/dL (ref 8–23)
CO2: 33 mmol/L — ABNORMAL HIGH (ref 22–32)
Calcium: 9.4 mg/dL (ref 8.9–10.3)
Chloride: 91 mmol/L — ABNORMAL LOW (ref 98–111)
Creatinine, Ser: 0.67 mg/dL (ref 0.44–1.00)
GFR calc Af Amer: >60 mL/min (ref >60)
GFR calc non Af Amer: >60 mL/min (ref >60)
Glucose, Bld: 100 mg/dL — ABNORMAL HIGH (ref 70–99)
Potassium: 3.8 mmol/L (ref 3.5–5.1)
Sodium: 136 mmol/L (ref 135–145)

## 2018-03-01 LAB — URIC ACID: Uric Acid, Serum: 5.3 mg/dL (ref 2.5–7.1)

## 2018-03-01 LAB — C DIFFICILE QUICK SCREEN W PCR REFLEX
C DIFFICILE (CDIFF) INTERP: NOT DETECTED
C Diff antigen: NEGATIVE
C Diff toxin: NEGATIVE

## 2018-03-01 NOTE — Progress Notes (Signed)
Pulmonary Critical Care Medicine [facility] [place]   PULMONARY CRITICAL CARE SERVICE  PROGRESS NOTE  Date of Service: 03/01/2018  [name]  [id number]  DOB: [birth year]   DOA: 02/07/2018  Referring Physician: Carron Curie, MD  HPI: Barbara Osborn is a 68 y.o. female seen for follow up of Acute on Chronic Respiratory Failure.  Patient is on 2 L comfortable right now without distress.  Good saturations are noted  Medications: Reviewed on Rounds  Physical Exam:  Vitals: Temperature 98.0 pulse 86 respiratory 18 blood pressure 140/74 saturations 97%  Ventilator Settings off the ventilator on 2 L  . General: Comfortable at this time . Eyes: Grossly normal lids, irises & conjunctiva . ENT: grossly tongue is normal . Neck: no obvious mass . Cardiovascular: S1 S2 normal no gallop . Respiratory: Scattered rhonchi expansion is equal . Abdomen: soft . Skin: no rash seen on limited exam . Musculoskeletal: not rigid . Psychiatric:unable to assess . Neurologic: no seizure no involuntary movements         Lab Data:   Basic Metabolic Panel: Recent Labs  Lab 02/25/18 0436 02/26/18 0906 02/27/18 0653 02/28/18 0650 03/01/18 0550  NA 135  --   --  136 136  K 3.0* 3.3* 4.0 4.0 3.8  CL 86*  --   --  90* 91*  CO2 36*  --   --  35* 33*  GLUCOSE 105*  --   --  105* 100*  BUN 14  --   --  14 12  CREATININE 0.69  --   --  0.65 0.67  CALCIUM 9.3  --   --  9.2 9.4  MG 1.8  --   --   --   --     ABG: No results for input(s): PHART, PCO2ART, PO2ART, HCO3, O2SAT in the last 168 hours.  Liver Function Tests: No results for input(s): AST, ALT, ALKPHOS, BILITOT, PROT, ALBUMIN in the last 168 hours. No results for input(s): LIPASE, AMYLASE in the last 168 hours. No results for input(s): AMMONIA in the last 168 hours.  CBC: Recent Labs  Lab 02/25/18 0436 02/28/18 0650  WBC 7.9 7.3  HGB 14.6 13.9  HCT 47.0* 46.6*  MCV 94.4 95.9  PLT 270 225    Cardiac  Enzymes: No results for input(s): CKTOTAL, CKMB, CKMBINDEX, TROPONINI in the last 168 hours.  BNP (last 3 results) No results for input(s): BNP in the last 8760 hours.  ProBNP (last 3 results) No results for input(s): PROBNP in the last 8760 hours.  Radiological Exams: Dg Foot 2 Views Left  Result Date: 02/28/2018 CLINICAL DATA:  Foot pain EXAM: LEFT FOOT - 2 VIEW COMPARISON:  None. FINDINGS: No fracture or malalignment. Fixating screws within the head of the second metatarsal. Small wire cerclage at the first proximal phalanx. Truncated appearance of the fifth proximal phalanx which may be secondary to prior surgery or sequela of remote infection. No radiopaque foreign body. IMPRESSION: No acute osseous abnormality. Postsurgical changes of the first and second digits Electronically Signed   By: Jasmine Pang M.D.   On: 02/28/2018 20:01    Assessment/Plan Active Problems:   Acute on chronic respiratory failure with hypoxia (HCC)   Primary pulmonary hypertension (HCC)   Chronic atrial fibrillation   Bilateral pulmonary contusion   Severe sepsis (HCC)   Multiple trauma to chest   1. Acute on chronic respiratory failure with hypoxia we will continue with oxygen therapy titrate down as tolerated 2.  Primary pulmonary hypertension on oxygen therapy 3. Chronic atrial fibrillation rate controlled 4. Pulmonary contusion improved 5. Chest trauma improved 6. Severe sepsis resolved we will continue with present management   I have personally seen and evaluated the patient, evaluated laboratory and imaging results, formulated the assessment and plan and placed orders. The Patient requires high complexity decision making for assessment and support.  Case was discussed on Rounds with the Respiratory Therapy Staff  Yevonne Pax, MD [facility] Pulmonary Critical Care Medicine Sleep Medicine

## 2018-03-02 DIAGNOSIS — S27322S Contusion of lung, bilateral, sequela: Secondary | ICD-10-CM | POA: Diagnosis not present

## 2018-03-02 DIAGNOSIS — S299XXS Unspecified injury of thorax, sequela: Secondary | ICD-10-CM | POA: Diagnosis not present

## 2018-03-02 DIAGNOSIS — J9621 Acute and chronic respiratory failure with hypoxia: Secondary | ICD-10-CM | POA: Diagnosis not present

## 2018-03-02 DIAGNOSIS — I482 Chronic atrial fibrillation, unspecified: Secondary | ICD-10-CM | POA: Diagnosis not present

## 2018-03-02 LAB — PROTIME-INR
INR: 2.61
Prothrombin Time: 27.6 seconds — ABNORMAL HIGH (ref 11.4–15.2)

## 2018-03-02 NOTE — Progress Notes (Signed)
Pulmonary Critical Care Medicine [facility] [place]   PULMONARY CRITICAL CARE SERVICE  PROGRESS NOTE  Date of Service: 03/02/2018  Barbara Osborn  [id number]  DOB: [birth year]   DOA: 02/07/2018  Referring Physician: Carron Curie, MD  HPI: Barbara Osborn is a 68 y.o. female seen for follow up of Acute on Chronic Respiratory Failure.  Patient right now is on 2 L doing well.  Awaiting discharge planning  Medications: Reviewed on Rounds  Physical Exam:  Vitals: Temperature 97.6 pulse 83 respiratory rate 30 blood pressure 128/73 saturations 97%  Ventilator Settings off the ventilator on 2 L  . General: Comfortable at this time . Eyes: Grossly normal lids, irises & conjunctiva . ENT: grossly tongue is normal . Neck: no obvious mass . Cardiovascular: S1 S2 normal no gallop . Respiratory: Scattered rhonchi expansion is equal . Abdomen: soft . Skin: no rash seen on limited exam . Musculoskeletal: not rigid . Psychiatric:unable to assess . Neurologic: no seizure no involuntary movements         Lab Data:   Basic Metabolic Panel: Recent Labs  Lab 02/25/18 0436 02/26/18 0906 02/27/18 0653 02/28/18 0650 03/01/18 0550  NA 135  --   --  136 136  K 3.0* 3.3* 4.0 4.0 3.8  CL 86*  --   --  90* 91*  CO2 36*  --   --  35* 33*  GLUCOSE 105*  --   --  105* 100*  BUN 14  --   --  14 12  CREATININE 0.69  --   --  0.65 0.67  CALCIUM 9.3  --   --  9.2 9.4  MG 1.8  --   --   --   --     ABG: No results for input(s): PHART, PCO2ART, PO2ART, HCO3, O2SAT in the last 168 hours.  Liver Function Tests: No results for input(s): AST, ALT, ALKPHOS, BILITOT, PROT, ALBUMIN in the last 168 hours. No results for input(s): LIPASE, AMYLASE in the last 168 hours. No results for input(s): AMMONIA in the last 168 hours.  CBC: Recent Labs  Lab 02/25/18 0436 02/28/18 0650  WBC 7.9 7.3  HGB 14.6 13.9  HCT 47.0* 46.6*  MCV 94.4 95.9  PLT 270 225    Cardiac Enzymes: No  results for input(s): CKTOTAL, CKMB, CKMBINDEX, TROPONINI in the last 168 hours.  BNP (last 3 results) No results for input(s): BNP in the last 8760 hours.  ProBNP (last 3 results) No results for input(s): PROBNP in the last 8760 hours.  Radiological Exams: No results found.  Assessment/Plan Active Problems:   Acute on chronic respiratory failure with hypoxia (HCC)   Primary pulmonary hypertension (HCC)   Chronic atrial fibrillation   Bilateral pulmonary contusion   Severe sepsis (HCC)   Multiple trauma to chest   1. Acute on chronic respiratory failure with hypoxia we will continue with oxygen therapy titrate oxygen continue pulmonary toilet 2. Primary pulmonary hypertension at baseline we will continue to follow 3. Chronic atrial fibrillation rate controlled 4. Chest trauma improving 5. Pulmonary contusions improving 6. Sepsis resolved   I have personally seen and evaluated the patient, evaluated laboratory and imaging results, formulated the assessment and plan and placed orders. The Patient requires high complexity decision making for assessment and support.  Case was discussed on Rounds with the Respiratory Therapy Staff  Yevonne Pax, MD [facility] Pulmonary Critical Care Medicine Sleep Medicine

## 2018-03-03 DIAGNOSIS — J9621 Acute and chronic respiratory failure with hypoxia: Secondary | ICD-10-CM | POA: Diagnosis not present

## 2018-03-03 DIAGNOSIS — S27322S Contusion of lung, bilateral, sequela: Secondary | ICD-10-CM | POA: Diagnosis not present

## 2018-03-03 DIAGNOSIS — I482 Chronic atrial fibrillation, unspecified: Secondary | ICD-10-CM | POA: Diagnosis not present

## 2018-03-03 DIAGNOSIS — S299XXS Unspecified injury of thorax, sequela: Secondary | ICD-10-CM | POA: Diagnosis not present

## 2018-03-03 LAB — PROTIME-INR
INR: 2.77
Prothrombin Time: 28.8 seconds — ABNORMAL HIGH (ref 11.4–15.2)

## 2018-03-03 NOTE — Progress Notes (Signed)
Pulmonary Critical Care Medicine [facility] [place]   PULMONARY CRITICAL CARE SERVICE  PROGRESS NOTE  Date of Service: 03/03/2018  Barbara Osborn  [id number]  DOB: 1950-03-22   DOA: 02/07/2018  Referring Physician: Carron Curie, MD  HPI: Barbara Osborn is a 68 y.o. female seen for follow up of Acute on Chronic Respiratory Failure.  Patient is on 2 L doing fairly well.  Secretions are minimal  Medications: Reviewed on Rounds  Physical Exam:  Vitals: Temperature 98.0 pulse 79 respiratory rate 15 blood pressure 146/93 saturations 100%  Ventilator Settings 2 L oxygen  . General: Comfortable at this time . Eyes: Grossly normal lids, irises & conjunctiva . ENT: grossly tongue is normal . Neck: no obvious mass . Cardiovascular: S1 S2 normal no gallop . Respiratory: Coarse breath sounds with a few rhonchi . Abdomen: soft . Skin: no rash seen on limited exam . Musculoskeletal: not rigid . Psychiatric:unable to assess . Neurologic: no seizure no involuntary movements         Lab Data:   Basic Metabolic Panel: Recent Labs  Lab 02/25/18 0436 02/26/18 0906 02/27/18 0653 02/28/18 0650 03/01/18 0550  NA 135  --   --  136 136  K 3.0* 3.3* 4.0 4.0 3.8  CL 86*  --   --  90* 91*  CO2 36*  --   --  35* 33*  GLUCOSE 105*  --   --  105* 100*  BUN 14  --   --  14 12  CREATININE 0.69  --   --  0.65 0.67  CALCIUM 9.3  --   --  9.2 9.4  MG 1.8  --   --   --   --     ABG: No results for input(s): PHART, PCO2ART, PO2ART, HCO3, O2SAT in the last 168 hours.  Liver Function Tests: No results for input(s): AST, ALT, ALKPHOS, BILITOT, PROT, ALBUMIN in the last 168 hours. No results for input(s): LIPASE, AMYLASE in the last 168 hours. No results for input(s): AMMONIA in the last 168 hours.  CBC: Recent Labs  Lab 02/25/18 0436 02/28/18 0650  WBC 7.9 7.3  HGB 14.6 13.9  HCT 47.0* 46.6*  MCV 94.4 95.9  PLT 270 225    Cardiac Enzymes: No results for input(s):  CKTOTAL, CKMB, CKMBINDEX, TROPONINI in the last 168 hours.  BNP (last 3 results) No results for input(s): BNP in the last 8760 hours.  ProBNP (last 3 results) No results for input(s): PROBNP in the last 8760 hours.  Radiological Exams: No results found.  Assessment/Plan Active Problems:   Acute on chronic respiratory failure with hypoxia (HCC)   Primary pulmonary hypertension (HCC)   Chronic atrial fibrillation   Bilateral pulmonary contusion   Severe sepsis (HCC)   Multiple trauma to chest   1. Acute on chronic respiratory failure with hypoxia we will continue with oxygen therapy and 2 L continue pulmonary toilet supportive care 2. Primary pulmonary hypertension at baseline we will continue present management 3. Chronic atrial fibrillation rate controlled 4. Bilateral pulmonary contusion improving 5. Severe sepsis resolved hemodynamically stable   I have personally seen and evaluated the patient, evaluated laboratory and imaging results, formulated the assessment and plan and placed orders. The Patient requires high complexity decision making for assessment and support.  Case was discussed on Rounds with the Respiratory Therapy Staff  Yevonne Pax, MD [facility] Pulmonary Critical Care Medicine Sleep Medicine

## 2018-03-04 DIAGNOSIS — J9621 Acute and chronic respiratory failure with hypoxia: Secondary | ICD-10-CM | POA: Diagnosis not present

## 2018-03-04 DIAGNOSIS — I482 Chronic atrial fibrillation, unspecified: Secondary | ICD-10-CM | POA: Diagnosis not present

## 2018-03-04 DIAGNOSIS — S299XXS Unspecified injury of thorax, sequela: Secondary | ICD-10-CM | POA: Diagnosis not present

## 2018-03-04 DIAGNOSIS — S27322S Contusion of lung, bilateral, sequela: Secondary | ICD-10-CM | POA: Diagnosis not present

## 2018-03-04 LAB — BASIC METABOLIC PANEL
Anion gap: 9 (ref 5–15)
BUN: 14 mg/dL (ref 8–23)
CO2: 35 mmol/L — ABNORMAL HIGH (ref 22–32)
Calcium: 9.1 mg/dL (ref 8.9–10.3)
Chloride: 92 mmol/L — ABNORMAL LOW (ref 98–111)
Creatinine, Ser: 0.61 mg/dL (ref 0.44–1.00)
GFR calc non Af Amer: >60 mL/min (ref >60)
Glucose, Bld: 96 mg/dL (ref 70–99)
Potassium: 4.1 mmol/L (ref 3.5–5.1)
SODIUM: 136 mmol/L (ref 135–145)

## 2018-03-04 LAB — CBC
HCT: 45.9 % (ref 36.0–46.0)
Hemoglobin: 13.8 g/dL (ref 12.0–15.0)
MCH: 28.6 pg (ref 26.0–34.0)
MCHC: 30.1 g/dL (ref 30.0–36.0)
MCV: 95.2 fL (ref 80.0–100.0)
Platelets: 249 10*3/uL (ref 150–400)
RBC: 4.82 MIL/uL (ref 3.87–5.11)
RDW: 16.8 % — AB (ref 11.5–15.5)
WBC: 6.6 10*3/uL (ref 4.0–10.5)
nRBC: 0 % (ref 0.0–0.2)

## 2018-03-04 LAB — MAGNESIUM: Magnesium: 2 mg/dL (ref 1.7–2.4)

## 2018-03-04 LAB — PROTIME-INR
INR: 2.62
Prothrombin Time: 27.6 seconds — ABNORMAL HIGH (ref 11.4–15.2)

## 2018-03-04 NOTE — Progress Notes (Signed)
Pulmonary Critical Care Medicine [facility] [place]   PULMONARY CRITICAL CARE SERVICE  PROGRESS NOTE  Date of Service: 03/04/2018  Barbara Osborn  [id number]  DOB: Sep 04, 1950   DOA: 02/07/2018  Referring Physician: Carron Curie, MD  HPI: Barbara Osborn is a 68 y.o. female seen for follow up of Acute on Chronic Respiratory Failure.  Patient is on 2 L doing fairly well awaiting discharge planning  Medications: Reviewed on Rounds  Physical Exam:  Vitals: Temperature 97.6 pulse 80 respiratory rate 16 blood pressure 127/70 saturations 97%  Ventilator Settings off the ventilator on 2 L  . General: Comfortable at this time . Eyes: Grossly normal lids, irises & conjunctiva . ENT: grossly tongue is normal . Neck: no obvious mass . Cardiovascular: S1 S2 normal no gallop . Respiratory: No rhonchi or rales are noted . Abdomen: soft . Skin: no rash seen on limited exam . Musculoskeletal: not rigid . Psychiatric:unable to assess . Neurologic: no seizure no involuntary movements         Lab Data:   Basic Metabolic Panel: Recent Labs  Lab 02/26/18 0906 02/27/18 0653 02/28/18 0650 03/01/18 0550 03/04/18 0632  NA  --   --  136 136 136  K 3.3* 4.0 4.0 3.8 4.1  CL  --   --  90* 91* 92*  CO2  --   --  35* 33* 35*  GLUCOSE  --   --  105* 100* 96  BUN  --   --  14 12 14   CREATININE  --   --  0.65 0.67 0.61  CALCIUM  --   --  9.2 9.4 9.1  MG  --   --   --   --  2.0    ABG: No results for input(s): PHART, PCO2ART, PO2ART, HCO3, O2SAT in the last 168 hours.  Liver Function Tests: No results for input(s): AST, ALT, ALKPHOS, BILITOT, PROT, ALBUMIN in the last 168 hours. No results for input(s): LIPASE, AMYLASE in the last 168 hours. No results for input(s): AMMONIA in the last 168 hours.  CBC: Recent Labs  Lab 02/28/18 0650 03/04/18 0632  WBC 7.3 6.6  HGB 13.9 13.8  HCT 46.6* 45.9  MCV 95.9 95.2  PLT 225 249    Cardiac Enzymes: No results for  input(s): CKTOTAL, CKMB, CKMBINDEX, TROPONINI in the last 168 hours.  BNP (last 3 results) No results for input(s): BNP in the last 8760 hours.  ProBNP (last 3 results) No results for input(s): PROBNP in the last 8760 hours.  Radiological Exams: No results found.  Assessment/Plan Active Problems:   Acute on chronic respiratory failure with hypoxia (HCC)   Primary pulmonary hypertension (HCC)   Chronic atrial fibrillation   Bilateral pulmonary contusion   Severe sepsis (HCC)   Multiple trauma to chest   1. Acute on chronic respiratory failure with hypoxia patient will continue with oxygen therapy supportive care 2. Primary pulmonary hypertension at baseline continue present therapy 3. Chronic atrial fibrillation rate controlled 4. Severe sepsis resolved 5. Chest trauma with pulmonary contusions we will continue with supportive care clinically improving   I have personally seen and evaluated the patient, evaluated laboratory and imaging results, formulated the assessment and plan and placed orders. The Patient requires high complexity decision making for assessment and support.  Case was discussed on Rounds with the Respiratory Therapy Staff  Yevonne Pax, MD [facility] Pulmonary Critical Care Medicine Sleep Medicine

## 2018-03-05 DIAGNOSIS — S299XXS Unspecified injury of thorax, sequela: Secondary | ICD-10-CM | POA: Diagnosis not present

## 2018-03-05 DIAGNOSIS — I482 Chronic atrial fibrillation, unspecified: Secondary | ICD-10-CM | POA: Diagnosis not present

## 2018-03-05 DIAGNOSIS — S27322S Contusion of lung, bilateral, sequela: Secondary | ICD-10-CM | POA: Diagnosis not present

## 2018-03-05 DIAGNOSIS — J9621 Acute and chronic respiratory failure with hypoxia: Secondary | ICD-10-CM | POA: Diagnosis not present

## 2018-03-05 LAB — PROTIME-INR
INR: 2.36
Prothrombin Time: 25.5 seconds — ABNORMAL HIGH (ref 11.4–15.2)

## 2018-03-05 NOTE — Progress Notes (Addendum)
Pulmonary Critical Care Medicine [facility] [place]   PULMONARY CRITICAL CARE SERVICE  PROGRESS NOTE  Date of Service: 03/05/2018  Barbara Osborn  [id number]  DOB: Dec 29, 1950   DOA: 02/07/2018  Referring Physician: Carron Curie, MD  HPI: Barbara Osborn is a 68 y.o. female seen for follow up of Acute on Chronic Respiratory Failure.  Patient continues to rest well on 2 L of oxygen via nasal cannula.  She continues to await discharge planning.  Medications: Reviewed on Rounds  Physical Exam:  Vitals: Pulse 90 respirations 18 BP 135/71 O2 sat 98% temp 98.4  Ventilator Settings not currently on ventilator  . General: Comfortable at this time . Eyes: Grossly normal lids, irises & conjunctiva . ENT: grossly tongue is normal . Neck: no obvious mass . Cardiovascular: S1 S2 normal no gallop . Respiratory: No rales or rhonchi noted . Abdomen: soft . Skin: no rash seen on limited exam . Musculoskeletal: not rigid . Psychiatric:unable to assess . Neurologic: no seizure no involuntary movements         Lab Data:   Basic Metabolic Panel: Recent Labs  Lab 02/27/18 0653 02/28/18 0650 03/01/18 0550 03/04/18 0632  NA  --  136 136 136  K 4.0 4.0 3.8 4.1  CL  --  90* 91* 92*  CO2  --  35* 33* 35*  GLUCOSE  --  105* 100* 96  BUN  --  14 12 14   CREATININE  --  0.65 0.67 0.61  CALCIUM  --  9.2 9.4 9.1  MG  --   --   --  2.0    ABG: No results for input(s): PHART, PCO2ART, PO2ART, HCO3, O2SAT in the last 168 hours.  Liver Function Tests: No results for input(s): AST, ALT, ALKPHOS, BILITOT, PROT, ALBUMIN in the last 168 hours. No results for input(s): LIPASE, AMYLASE in the last 168 hours. No results for input(s): AMMONIA in the last 168 hours.  CBC: Recent Labs  Lab 02/28/18 0650 03/04/18 0632  WBC 7.3 6.6  HGB 13.9 13.8  HCT 46.6* 45.9  MCV 95.9 95.2  PLT 225 249    Cardiac Enzymes: No results for input(s): CKTOTAL, CKMB, CKMBINDEX, TROPONINI in  the last 168 hours.  BNP (last 3 results) No results for input(s): BNP in the last 8760 hours.  ProBNP (last 3 results) No results for input(s): PROBNP in the last 8760 hours.  Radiological Exams: No results found.  Assessment/Plan Active Problems:   Acute on chronic respiratory failure with hypoxia (HCC)   Primary pulmonary hypertension (HCC)   Chronic atrial fibrillation   Bilateral pulmonary contusion   Severe sepsis (HCC)   Multiple trauma to chest   1. Acute on chronic respiratory failure with hypoxia patient continue with oxygen therapy and supportive care. 2. Primary pulmonary hypertension at baseline continue present therapy 3. Chronic atrial fibrillation rate controlled 4. Severe sepsis resolved 5. Chest trauma with pulmonary contusion we will continue with supportive care clinically improving   I have personally seen and evaluated the patient, evaluated laboratory and imaging results, formulated the assessment and plan and placed orders. The Patient requires high complexity decision making for assessment and support.  Case was discussed on Rounds with the Respiratory Therapy Staff  Yevonne Pax, MD [facility] Pulmonary Critical Care Medicine Sleep Medicine

## 2018-03-06 DIAGNOSIS — J9621 Acute and chronic respiratory failure with hypoxia: Secondary | ICD-10-CM | POA: Diagnosis not present

## 2018-03-06 DIAGNOSIS — S299XXS Unspecified injury of thorax, sequela: Secondary | ICD-10-CM | POA: Diagnosis not present

## 2018-03-06 DIAGNOSIS — I482 Chronic atrial fibrillation, unspecified: Secondary | ICD-10-CM | POA: Diagnosis not present

## 2018-03-06 DIAGNOSIS — S27322S Contusion of lung, bilateral, sequela: Secondary | ICD-10-CM | POA: Diagnosis not present

## 2018-03-06 LAB — PROTIME-INR
INR: 2.28
Prothrombin Time: 24.8 seconds — ABNORMAL HIGH (ref 11.4–15.2)

## 2018-03-06 NOTE — Progress Notes (Addendum)
Pulmonary Critical Care Medicine [facility] [place]   PULMONARY CRITICAL CARE SERVICE  PROGRESS NOTE  Date of Service: 03/06/2018  Barbara Osborn  LOV:[id number]  DOB: November 13, 1950   DOA: 02/07/2018  Referring Physician: Carron Curie, MD  HPI: Barbara Osborn is a 68 y.o. female seen for follow up of Acute on Chronic Respiratory Failure.  Patient continues to do well decannulated on 2 L of oxygen via nasal cannula.  Staff is still providing care to stoma.  Medications: Reviewed on Rounds  Physical Exam:  Vitals: Pulse 76 respirations 16 BP 135/76 O2 sat 98% temp 98.1  Ventilator Settings not currently on ventilator  . General: Comfortable at this time . Eyes: Grossly normal lids, irises & conjunctiva . ENT: grossly tongue is normal . Neck: no obvious mass . Cardiovascular: S1 S2 normal no gallop . Respiratory: No rales or rhonchi noted . Abdomen: soft . Skin: no rash seen on limited exam . Musculoskeletal: not rigid . Psychiatric:unable to assess . Neurologic: no seizure no involuntary movements         Lab Data:   Basic Metabolic Panel: Recent Labs  Lab 02/28/18 0650 03/01/18 0550 03/04/18 0632  NA 136 136 136  K 4.0 3.8 4.1  CL 90* 91* 92*  CO2 35* 33* 35*  GLUCOSE 105* 100* 96  BUN 14 12 14   CREATININE 0.65 0.67 0.61  CALCIUM 9.2 9.4 9.1  MG  --   --  2.0    ABG: No results for input(s): PHART, PCO2ART, PO2ART, HCO3, O2SAT in the last 168 hours.  Liver Function Tests: No results for input(s): AST, ALT, ALKPHOS, BILITOT, PROT, ALBUMIN in the last 168 hours. No results for input(s): LIPASE, AMYLASE in the last 168 hours. No results for input(s): AMMONIA in the last 168 hours.  CBC: Recent Labs  Lab 02/28/18 0650 03/04/18 0632  WBC 7.3 6.6  HGB 13.9 13.8  HCT 46.6* 45.9  MCV 95.9 95.2  PLT 225 249    Cardiac Enzymes: No results for input(s): CKTOTAL, CKMB, CKMBINDEX, TROPONINI in the last 168 hours.  BNP (last 3 results) No  results for input(s): BNP in the last 8760 hours.  ProBNP (last 3 results) No results for input(s): PROBNP in the last 8760 hours.  Radiological Exams: No results found.  Assessment/Plan Active Problems:   Acute on chronic respiratory failure with hypoxia (HCC)   Primary pulmonary hypertension (HCC)   Chronic atrial fibrillation   Bilateral pulmonary contusion   Severe sepsis (HCC)   Multiple trauma to chest   1. Acute on chronic respiratory failure with hypoxia patient will continue with oxygen therapy and supportive measures at this time. 2. Primary pulmonary hypertension at baseline continue present therapy 3. Chronic atrial fibrillation rate controlled 4. Severe sepsis resolved 5. Chest trauma with pulmonary contusion continue supportive care   I have personally seen and evaluated the patient, evaluated laboratory and imaging results, formulated the assessment and plan and placed orders. The Patient requires high complexity decision making for assessment and support.  Case was discussed on Rounds with the Respiratory Therapy Staff  Yevonne Pax, MD [facility] Pulmonary Critical Care Medicine Sleep Medicine

## 2018-03-07 DIAGNOSIS — S299XXS Unspecified injury of thorax, sequela: Secondary | ICD-10-CM | POA: Diagnosis not present

## 2018-03-07 DIAGNOSIS — I482 Chronic atrial fibrillation, unspecified: Secondary | ICD-10-CM | POA: Diagnosis not present

## 2018-03-07 DIAGNOSIS — S27322S Contusion of lung, bilateral, sequela: Secondary | ICD-10-CM | POA: Diagnosis not present

## 2018-03-07 DIAGNOSIS — J9621 Acute and chronic respiratory failure with hypoxia: Secondary | ICD-10-CM | POA: Diagnosis not present

## 2018-03-07 LAB — PROTIME-INR
INR: 2
Prothrombin Time: 22.4 seconds — ABNORMAL HIGH (ref 11.4–15.2)

## 2018-03-07 NOTE — Progress Notes (Signed)
Pulmonary Critical Care Medicine [facility] [place]   PULMONARY CRITICAL CARE SERVICE  PROGRESS NOTE  Date of Service: 03/07/2018  Barbara Osborn  VOJ:[id number]  DOB: 1950/12/27   DOA: 02/07/2018  Referring Physician: Carron Curie, MD  HPI: Barbara Osborn is a 68 y.o. female seen for follow up of Acute on Chronic Respiratory Failure.  Doing well patient is on 2 L nasal cannula good saturations are noted at this time  Medications: Reviewed on Rounds  Physical Exam:  Vitals: Temperature 97.4 pulse 86 respiratory 18 blood pressure 130/78 saturation 94%  Ventilator Settings off the ventilator on 2 L  . General: Comfortable at this time . Eyes: Grossly normal lids, irises & conjunctiva . ENT: grossly tongue is normal . Neck: no obvious mass . Cardiovascular: S1 S2 normal no gallop . Respiratory: No rhonchi or rales are noted at this time . Abdomen: soft . Skin: no rash seen on limited exam . Musculoskeletal: not rigid . Psychiatric:unable to assess . Neurologic: no seizure no involuntary movements         Lab Data:   Basic Metabolic Panel: Recent Labs  Lab 03/01/18 0550 03/04/18 0632  NA 136 136  K 3.8 4.1  CL 91* 92*  CO2 33* 35*  GLUCOSE 100* 96  BUN 12 14  CREATININE 0.67 0.61  CALCIUM 9.4 9.1  MG  --  2.0    ABG: No results for input(s): PHART, PCO2ART, PO2ART, HCO3, O2SAT in the last 168 hours.  Liver Function Tests: No results for input(s): AST, ALT, ALKPHOS, BILITOT, PROT, ALBUMIN in the last 168 hours. No results for input(s): LIPASE, AMYLASE in the last 168 hours. No results for input(s): AMMONIA in the last 168 hours.  CBC: Recent Labs  Lab 03/04/18 0632  WBC 6.6  HGB 13.8  HCT 45.9  MCV 95.2  PLT 249    Cardiac Enzymes: No results for input(s): CKTOTAL, CKMB, CKMBINDEX, TROPONINI in the last 168 hours.  BNP (last 3 results) No results for input(s): BNP in the last 8760 hours.  ProBNP (last 3 results) No results for  input(s): PROBNP in the last 8760 hours.  Radiological Exams: No results found.  Assessment/Plan Active Problems:   Acute on chronic respiratory failure with hypoxia (HCC)   Primary pulmonary hypertension (HCC)   Chronic atrial fibrillation   Bilateral pulmonary contusion   Severe sepsis (HCC)   Multiple trauma to chest   1. Acute on chronic respiratory failure with hypoxia we will continue with 2 L titrate oxygen as necessary awaiting discharge 2. Primary pulmonary hypertension stable continue present management 3. Chronic atrial fibrillation rate controlled 4. Pulmonary contusion with multiple chest trauma slowly improving 5. Severe sepsis resolved   I have personally seen and evaluated the patient, evaluated laboratory and imaging results, formulated the assessment and plan and placed orders. The Patient requires high complexity decision making for assessment and support.  Case was discussed on Rounds with the Respiratory Therapy Staff  Yevonne Pax, MD [facility] Pulmonary Critical Care Medicine Sleep Medicine

## 2018-03-08 LAB — PROTIME-INR
INR: 2.1 — ABNORMAL HIGH (ref 0.8–1.2)
Prothrombin Time: 23 seconds — ABNORMAL HIGH (ref 11.4–15.2)

## 2020-03-24 IMAGING — DX DG CHEST 1V PORT
1 series · 1 of 1 positions shown · non-contrast
Comparison: None.

CLINICAL DATA: Respiratory failure

EXAM:
PORTABLE CHEST 1 VIEW

[chest ap]
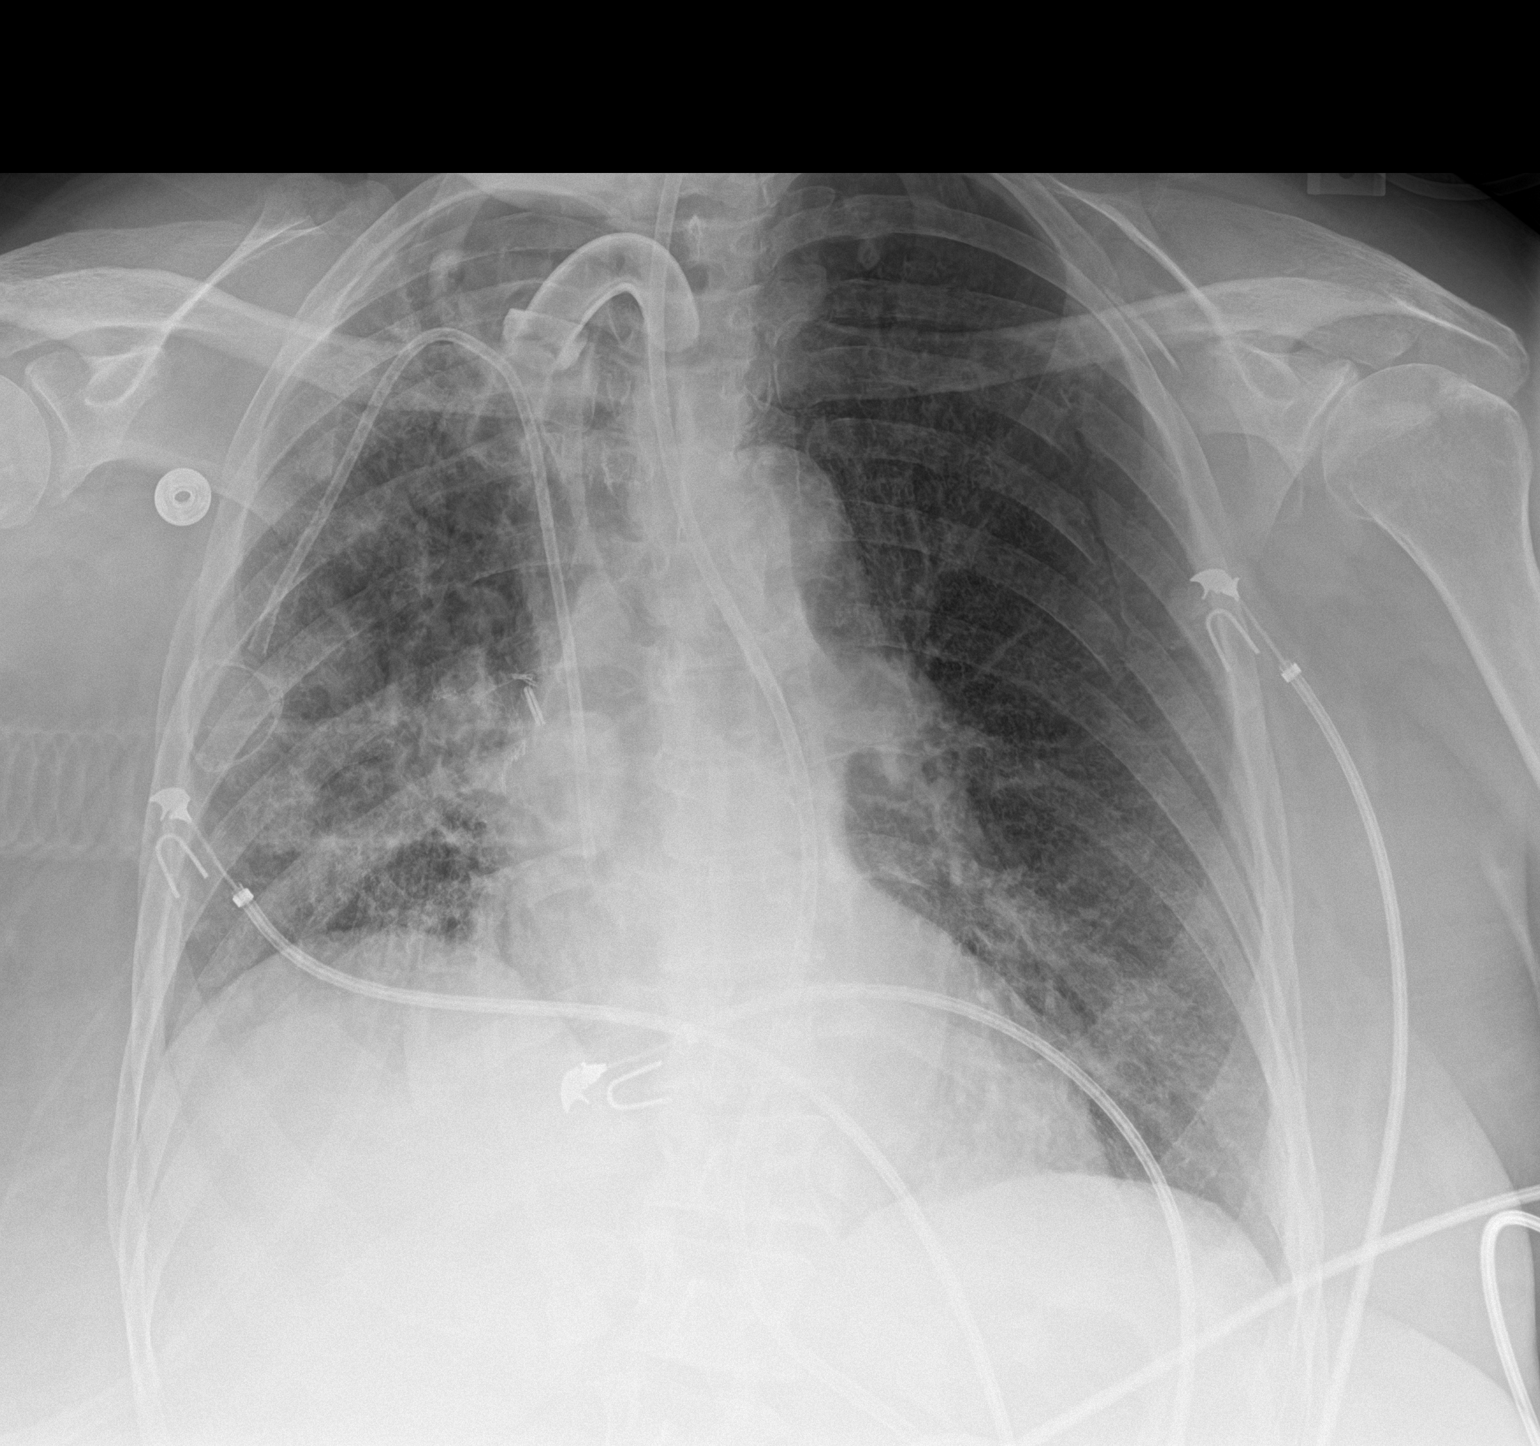

[1 of 1 positions shown; findings below may reference images not displayed]

FINDINGS: Cardiac shadows within normal limits. Tracheostomy tube, feeding
catheter and right chest wall port are noted in satisfactory
position. Cardiac shadow is mildly enlarged but accentuated by the
frontal technique. Left lung is well aerated with minimal basilar
atelectasis. Volume loss on the right is noted consistent with prior
surgical changes. Some basilar atelectatic changes are noted as
well. No pneumothorax is seen.
IMPRESSION: Bibasilar atelectasis.

Postoperative changes on the right.

Tubes and lines as described.

## 2020-03-29 IMAGING — DX DG KNEE 1-2V PORT*L*
2 series · 2 of 2 positions shown · non-contrast
Comparison: None.

CLINICAL DATA: Acute left knee pain for the past day.  No injury.

EXAM:
PORTABLE LEFT KNEE - 1-2 VIEW

[knee ap]
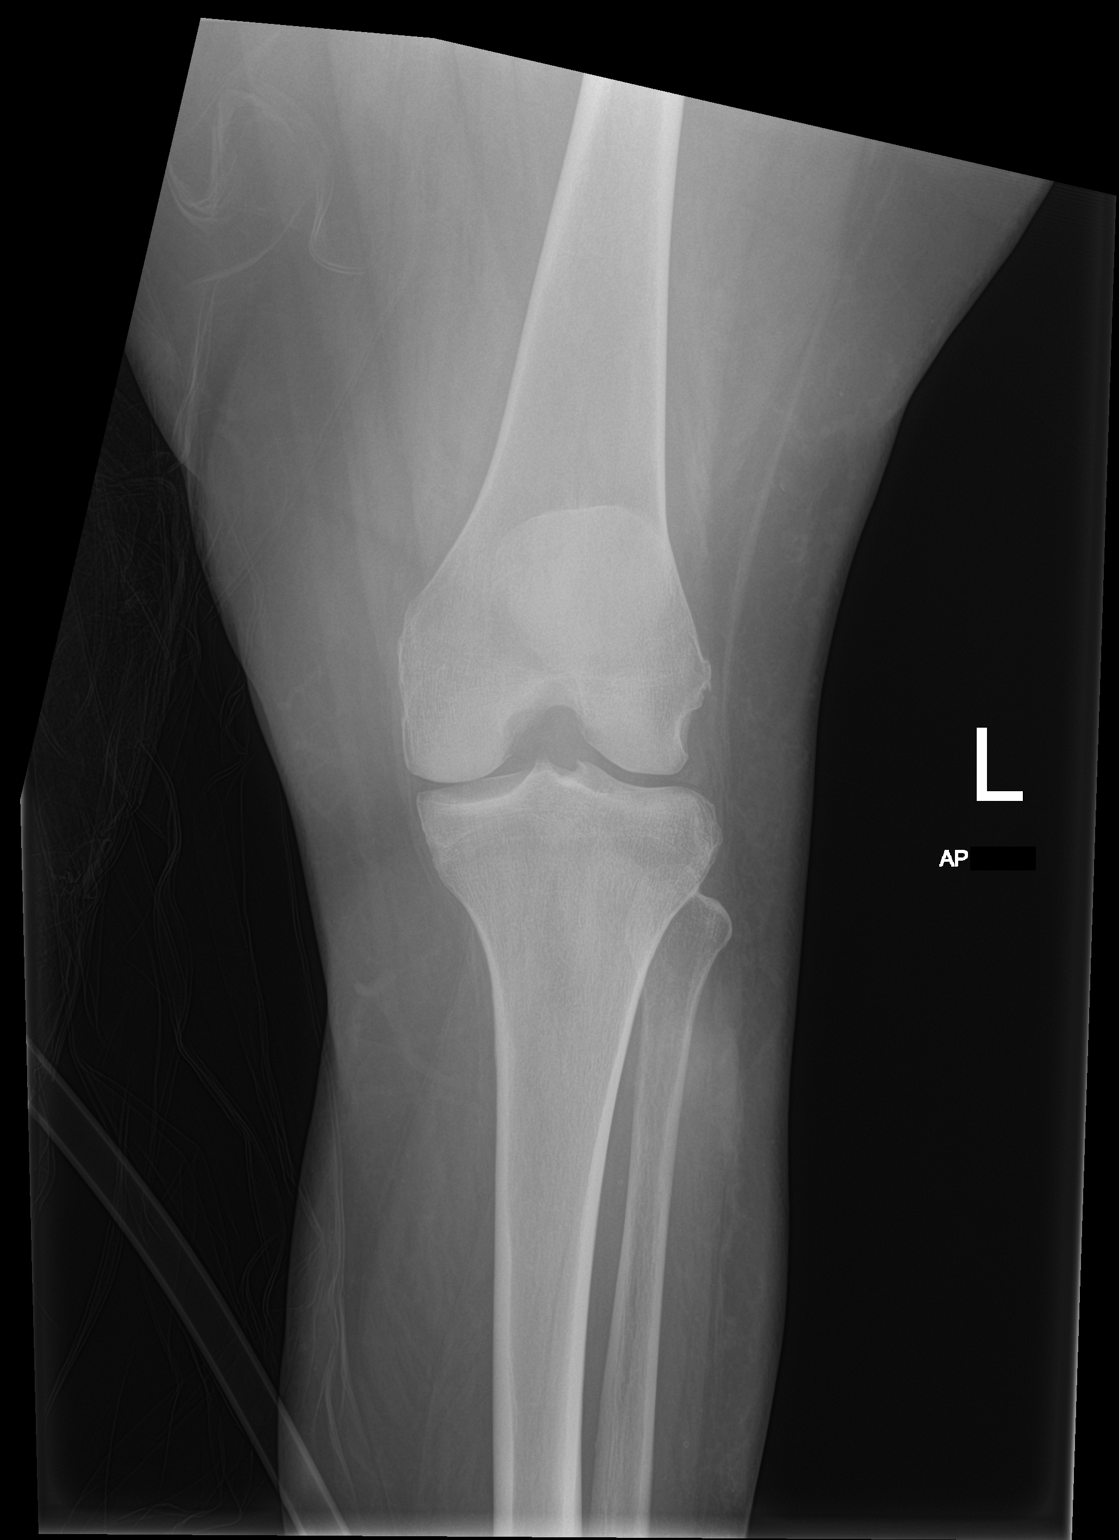

[knee lat]
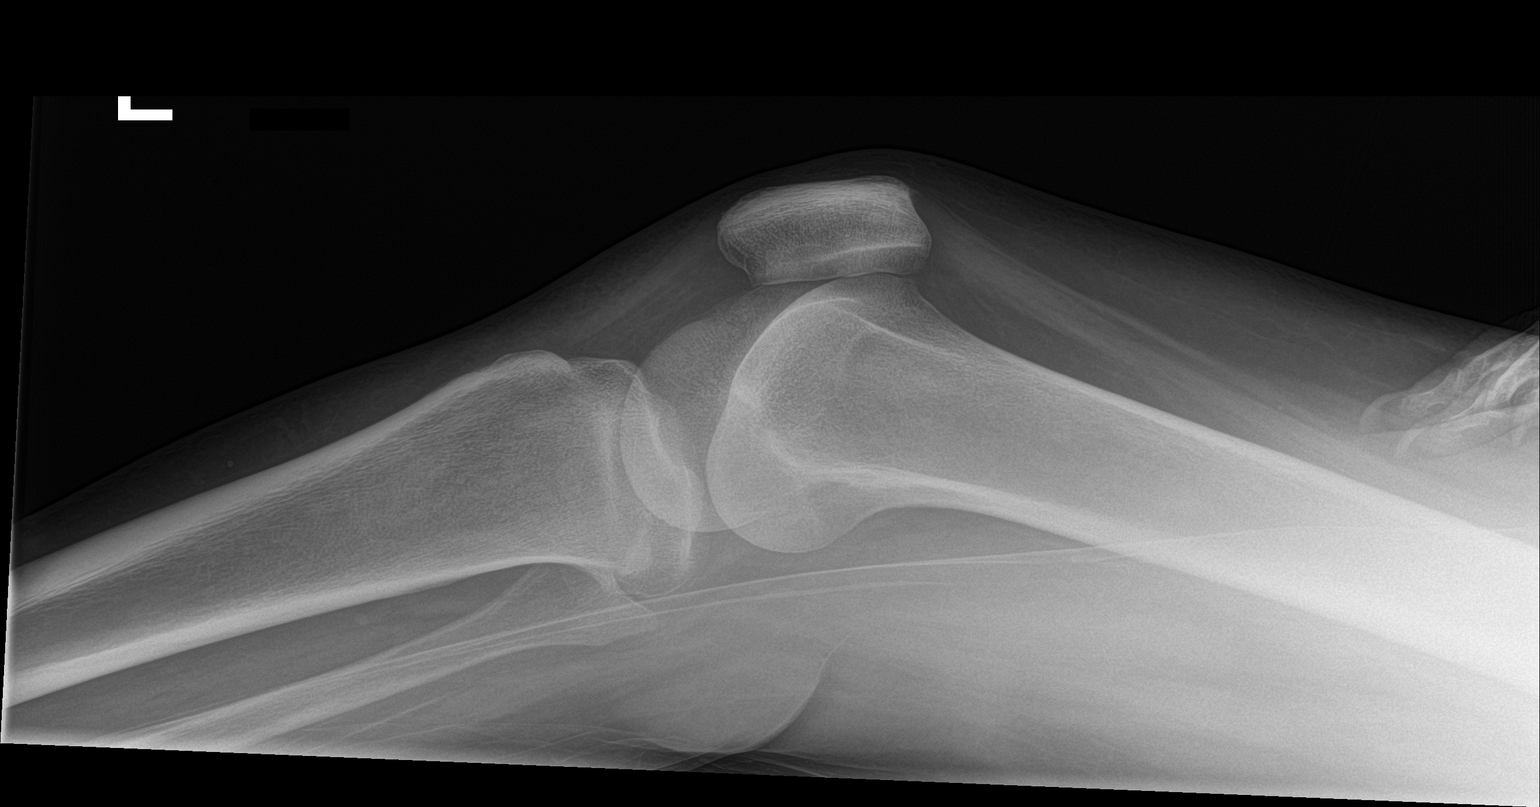

[2 of 2 positions shown; findings below may reference images not displayed]

FINDINGS: No evidence of fracture, dislocation, or joint effusion. No evidence
of arthropathy or other focal bone abnormality. Soft tissues are
unremarkable.
IMPRESSION: Negative.

## 2020-04-12 IMAGING — DX DG FOOT 2V*L*
2 series · 4 of 4 positions shown · non-contrast
Comparison: None.

CLINICAL DATA: Foot pain

EXAM:
LEFT FOOT - 2 VIEW

[Series 1: foot · 0.14mm/px · 2 of 2 slices shown]
[im 1/2]
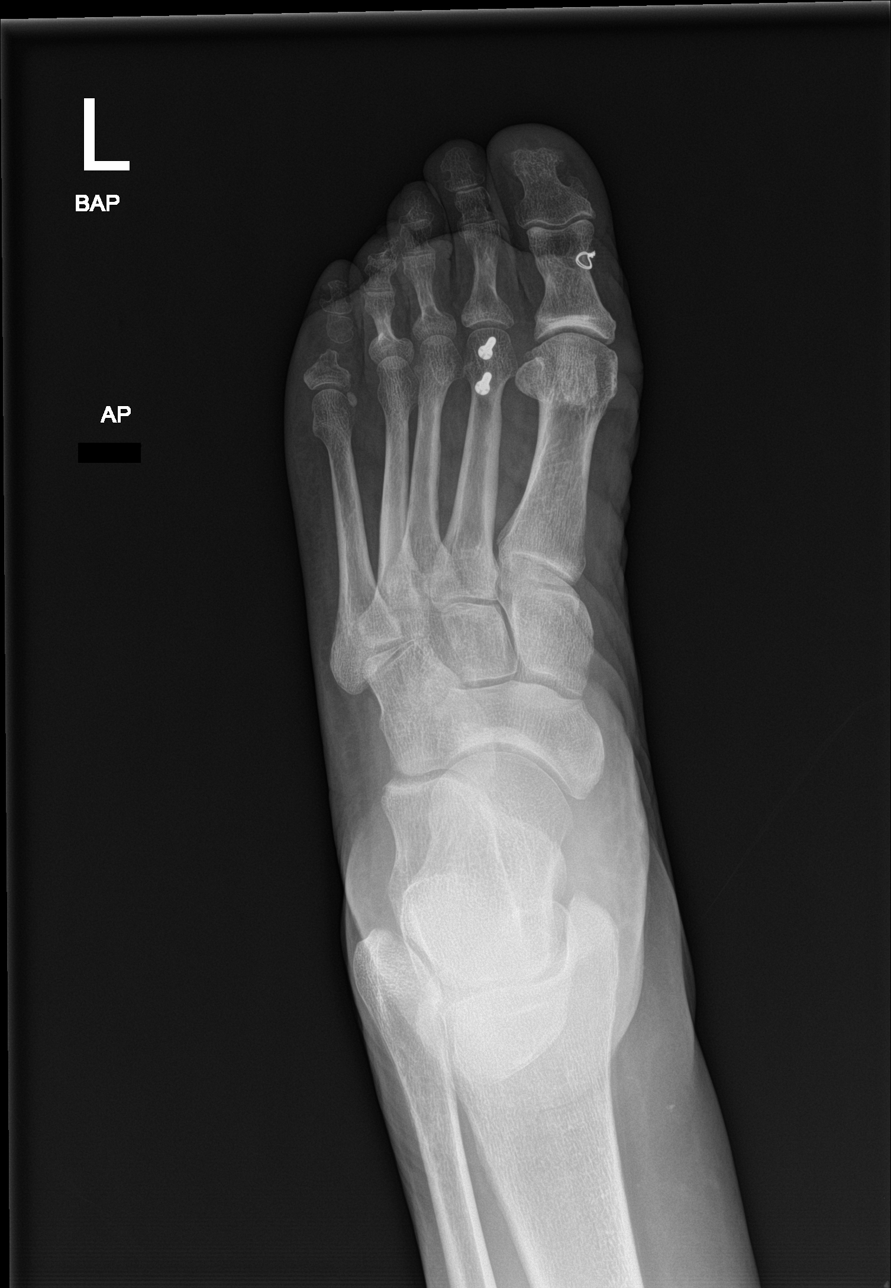
[im 2/2]
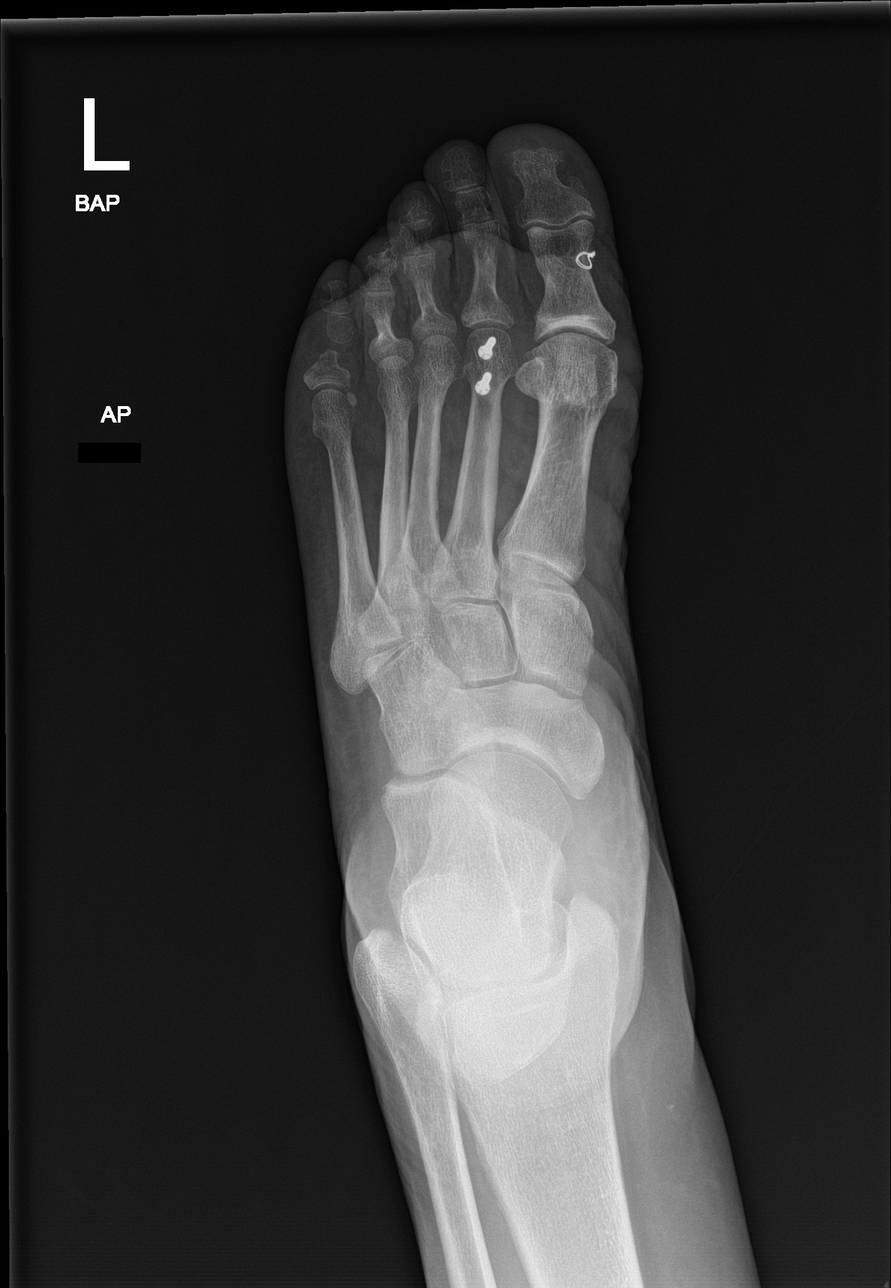

[Series 2: leg · 0.14mm/px · 2 of 2 slices shown]
[im 1/2]
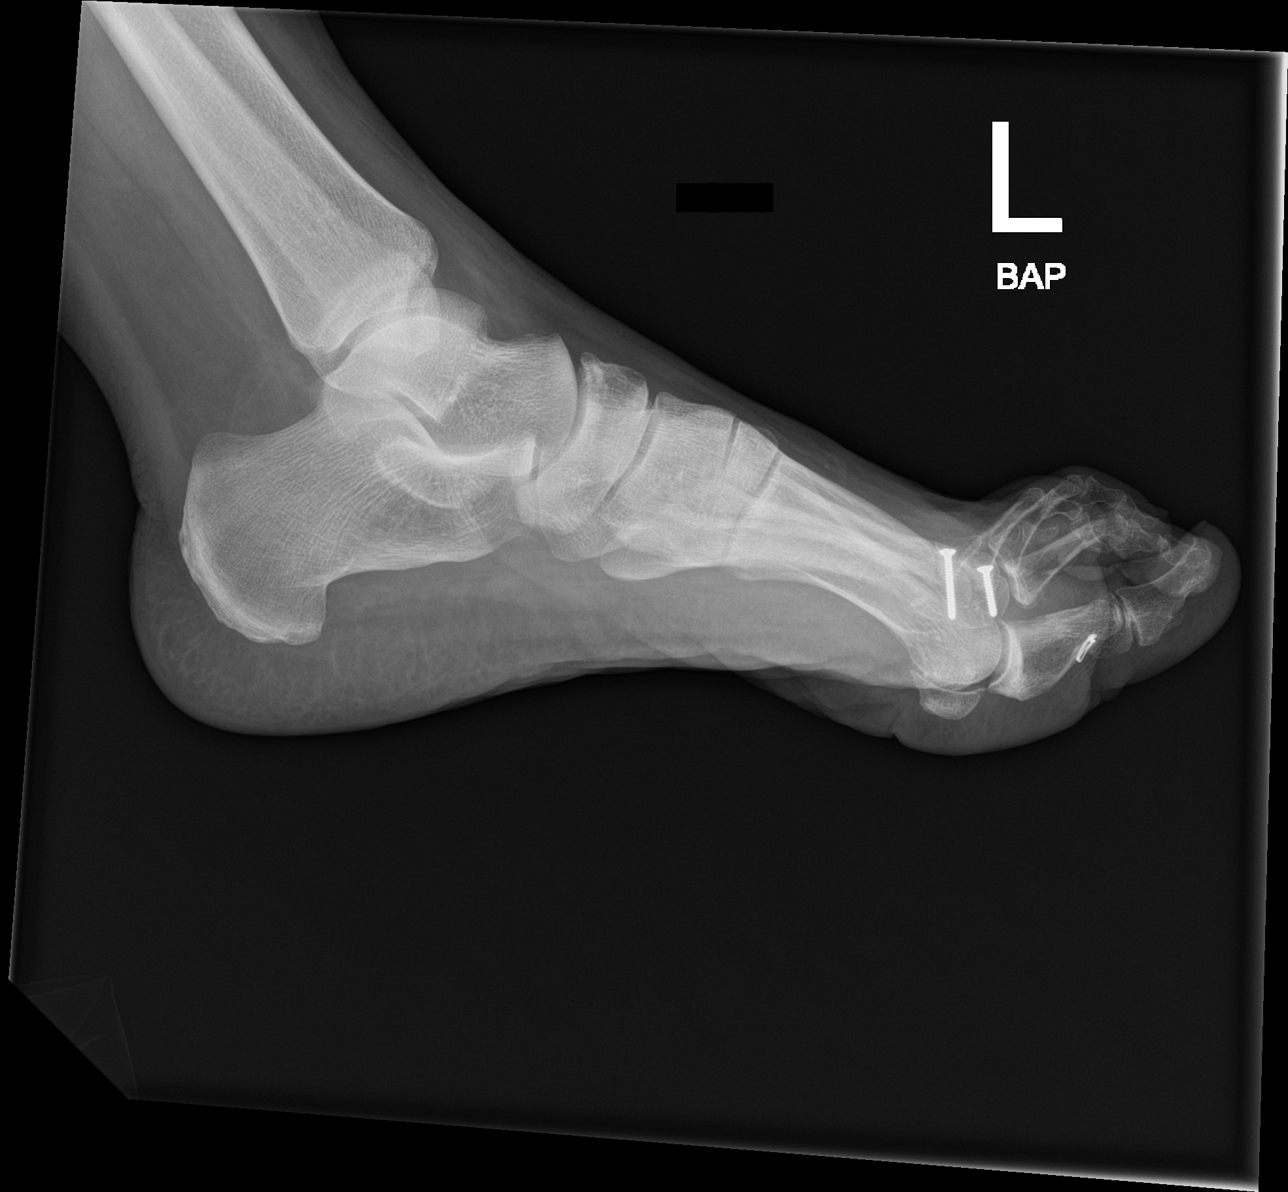
[im 2/2]
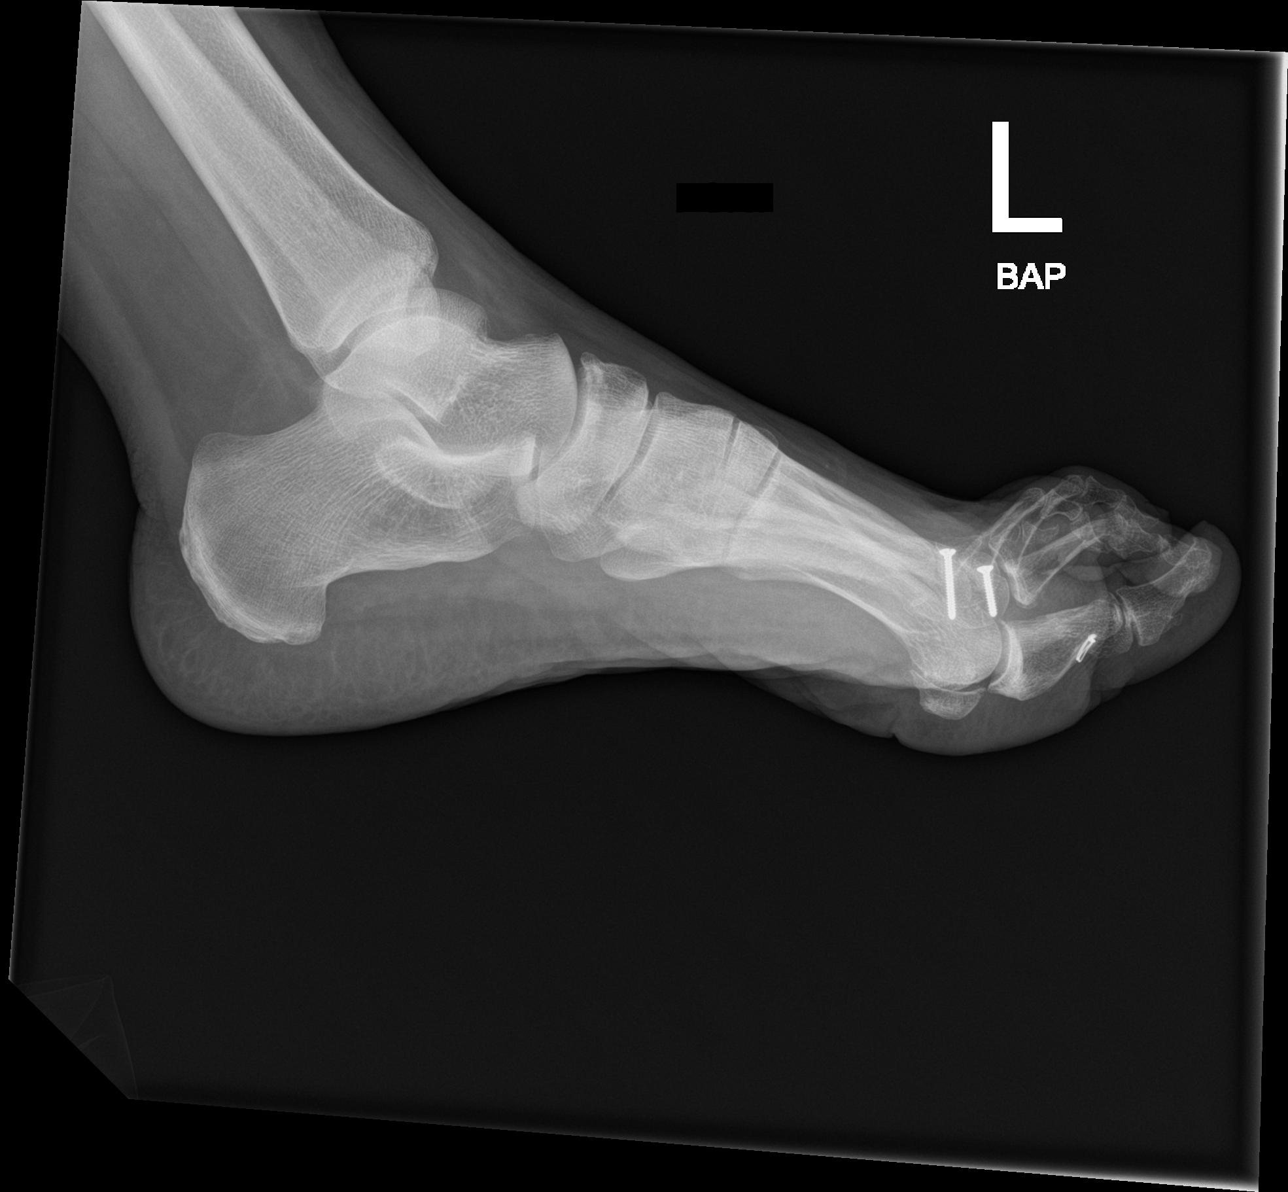

[4 of 4 positions shown; findings below may reference images not displayed]

FINDINGS: No fracture or malalignment. Fixating screws within the head of the
second metatarsal. Small wire cerclage at the first proximal
phalanx. Truncated appearance of the fifth proximal phalanx which
may be secondary to prior surgery or sequela of remote infection. No
radiopaque foreign body.
IMPRESSION: No acute osseous abnormality. Postsurgical changes of the first and
second digits

## 2022-01-12 DEATH — deceased
